# Patient Record
Sex: Male | Born: 1935 | Race: White | Hispanic: No | Marital: Married | Smoking: Former smoker
Health system: Southern US, Community
[De-identification: ages and names within clinical notes are randomized; demographics above are authoritative.]

## PROBLEM LIST (undated history)

## (undated) DIAGNOSIS — Z789 Other specified health status: Secondary | ICD-10-CM

## (undated) DIAGNOSIS — M1990 Unspecified osteoarthritis, unspecified site: Secondary | ICD-10-CM

## (undated) HISTORY — PX: TONSILLECTOMY: SUR1361

## (undated) HISTORY — PX: COLONOSCOPY: SHX174

---

## 1997-06-29 HISTORY — PX: INGUINAL HERNIA REPAIR: SUR1180

## 2005-11-18 ENCOUNTER — Ambulatory Visit (HOSPITAL_COMMUNITY): Admission: RE | Admit: 2005-11-18 | Discharge: 2005-11-18 | Payer: Self-pay | Admitting: Internal Medicine

## 2011-12-24 ENCOUNTER — Encounter (HOSPITAL_BASED_OUTPATIENT_CLINIC_OR_DEPARTMENT_OTHER): Payer: Self-pay | Admitting: *Deleted

## 2011-12-24 NOTE — Progress Notes (Signed)
Very nice man-healthy-no ht or resp  No labs needed

## 2011-12-25 ENCOUNTER — Other Ambulatory Visit: Payer: Self-pay | Admitting: Orthopaedic Surgery

## 2011-12-27 NOTE — H&P (Signed)
Damon Morgan is an 76 y.o. male.   Chief Complaint: Left achilles pain HPI: He pushed a car about 10-12 weeks ago and has pain at left achilles. Not getting any better and walking with a limp. MRI scan show a left achilles complete tear. We have discussed repairing the achilles tear to improve function and stop pain.  Past Medical History  Diagnosis Date  . No pertinent past medical history   . Arthritis     Past Surgical History  Procedure Date  . Inguinal hernia repair 1999    lt  . Tonsillectomy   . Colonoscopy     No family history on file. Social History:  reports that he quit smoking about 37 years ago. He does not have any smokeless tobacco history on file. He reports that he drinks alcohol. He reports that he does not use illicit drugs.  Allergies: No Known Allergies  No prescriptions prior to admission    No results found for this or any previous visit (from the past 48 hour(s)). No results found.  Review of Systems  Constitutional: Negative.   HENT: Negative.   Eyes: Negative.   Respiratory: Negative.   Cardiovascular: Negative.   Gastrointestinal: Negative.   Genitourinary: Negative.   Musculoskeletal: Negative.   Skin: Negative.   Neurological: Negative.   Endo/Heme/Allergies: Negative.     Height 6\' 1"  (1.854 m), weight 101.152 kg (223 lb). Physical Exam  Constitutional: He is oriented to person, place, and time. He appears well-nourished.  HENT:  Head: Atraumatic.  Eyes: Pupils are equal, round, and reactive to light.  Neck: Normal range of motion.  Cardiovascular: Regular rhythm.   Respiratory: Breath sounds normal.  GI: Bowel sounds are normal.  Musculoskeletal:       Left ankle- defect at mid achilles Weak plantar flexion. Good n/v Walks with a limp  Neurological: He is oriented to person, place, and time.  Skin: Skin is dry.  Psychiatric: He has a normal mood and affect.     Assessment/Plan A: Left achilles rupture P: We have  discussed the risks / benefits of achilles repair and the post- op course. Also the fact he will be on crutches and eventually need therapy. This will be done as a out patient.  Alika Saladin R 12/27/2011, 8:26 PM

## 2011-12-29 ENCOUNTER — Encounter (HOSPITAL_COMMUNITY): Payer: Self-pay | Admitting: Pharmacy Technician

## 2011-12-29 ENCOUNTER — Other Ambulatory Visit: Payer: Self-pay | Admitting: Orthopaedic Surgery

## 2011-12-29 ENCOUNTER — Encounter (HOSPITAL_BASED_OUTPATIENT_CLINIC_OR_DEPARTMENT_OTHER): Admission: RE | Payer: Self-pay | Source: Ambulatory Visit

## 2011-12-29 ENCOUNTER — Ambulatory Visit (HOSPITAL_BASED_OUTPATIENT_CLINIC_OR_DEPARTMENT_OTHER): Admission: RE | Admit: 2011-12-29 | Payer: Medicare Other | Source: Ambulatory Visit | Admitting: Orthopaedic Surgery

## 2011-12-29 HISTORY — DX: Other specified health status: Z78.9

## 2011-12-29 HISTORY — DX: Unspecified osteoarthritis, unspecified site: M19.90

## 2011-12-29 SURGERY — REPAIR, TENDON, ACHILLES
Anesthesia: General | Laterality: Left

## 2012-01-06 ENCOUNTER — Encounter (HOSPITAL_COMMUNITY): Payer: Self-pay

## 2012-01-06 ENCOUNTER — Encounter (HOSPITAL_COMMUNITY)
Admission: RE | Admit: 2012-01-06 | Discharge: 2012-01-06 | Disposition: A | Discharge status: Home / Self Care | Payer: Medicare Other | Source: Ambulatory Visit | Attending: Orthopaedic Surgery | Admitting: Orthopaedic Surgery

## 2012-01-06 ENCOUNTER — Ambulatory Visit (HOSPITAL_COMMUNITY)
Admission: RE | Admit: 2012-01-06 | Discharge: 2012-01-06 | Disposition: A | Discharge status: Home / Self Care | Payer: Medicare Other | Source: Ambulatory Visit | Attending: Orthopaedic Surgery | Admitting: Orthopaedic Surgery

## 2012-01-06 DIAGNOSIS — M169 Osteoarthritis of hip, unspecified: Secondary | ICD-10-CM | POA: Insufficient documentation

## 2012-01-06 DIAGNOSIS — M1610 Unilateral primary osteoarthritis, unspecified hip: Secondary | ICD-10-CM | POA: Insufficient documentation

## 2012-01-06 DIAGNOSIS — Z01811 Encounter for preprocedural respiratory examination: Secondary | ICD-10-CM | POA: Insufficient documentation

## 2012-01-06 LAB — DIFFERENTIAL
Basophils Relative: 1 % (ref 0–1)
Eosinophils Relative: 2 % (ref 0–5)
Monocytes Absolute: 0.4 10*3/uL (ref 0.1–1.0)
Monocytes Relative: 7 % (ref 3–12)
Neutro Abs: 3.3 10*3/uL (ref 1.7–7.7)

## 2012-01-06 LAB — URINALYSIS, ROUTINE W REFLEX MICROSCOPIC
Bilirubin Urine: NEGATIVE
Glucose, UA: NEGATIVE mg/dL
Ketones, ur: 15 mg/dL — AB
Nitrite: NEGATIVE
Specific Gravity, Urine: 1.027 (ref 1.005–1.030)

## 2012-01-06 LAB — BASIC METABOLIC PANEL
BUN: 21 mg/dL (ref 6–23)
CO2: 27 mEq/L (ref 19–32)
Chloride: 103 mEq/L (ref 96–112)
Creatinine, Ser: 0.95 mg/dL (ref 0.50–1.35)
Glucose, Bld: 97 mg/dL (ref 70–99)
Potassium: 4.8 mEq/L (ref 3.5–5.1)

## 2012-01-06 LAB — CBC
HCT: 43.9 % (ref 39.0–52.0)
Hemoglobin: 15.3 g/dL (ref 13.0–17.0)
MCH: 31.9 pg (ref 26.0–34.0)
MCHC: 34.9 g/dL (ref 30.0–36.0)
MCV: 91.6 fL (ref 78.0–100.0)
RDW: 13.1 % (ref 11.5–15.5)

## 2012-01-06 LAB — SURGICAL PCR SCREEN: MRSA, PCR: NEGATIVE

## 2012-01-06 LAB — TYPE AND SCREEN: ABO/RH(D): B POS

## 2012-01-06 MED ORDER — LACTATED RINGERS IV SOLN: INTRAVENOUS | Status: DC

## 2012-01-06 NOTE — Pre-Procedure Instructions (Signed)
Damon Morgan  01/06/2012   Your procedure is scheduled on:  01-12-2012  Report to [facility] at Call [phone] after 8:00 AM for arrival time  Call this number if you have problems the morning of surgery: [phone]   Remember:   Do not eat food:or drinkAfter Midnight.  .  .  Take these medicines the morning of surgery with A SIP OF WATER:    Do not wear jewelry, make-up or nail polish.  Do not wear lotions, powders, or perfumes. You may wear deodorant.  Do not shave 48 hours prior to surgery. Men may shave face and neck.  Do not bring valuables to the hospital.  Contacts, dentures or bridgework may not be worn into surgery.  Leave suitcase in the car. After surgery it may be brought to your room.  For patients admitted to the hospital, checkout time is 11:00 AM the day of discharge.       Special Instructions: Incentive Spirometry - Practice and bring it with you on the day of surgery. and CHG Shower Use Special Wash: 1/2 bottle night before surgery and 1/2 bottle morning of surgery.   Please read over the following fact sheets that you were given: Pain Booklet, Blood Transfusion Information, MRSA Information and Surgical Site Infection Prevention

## 2012-01-06 NOTE — H&P (Signed)
Damon Morgan is an 76 y.o. male.   Chief Complaint: right hip pain HPI: Damon Morgan is a 76 year old male patient well known to our practice is complaining of increasing right hip pain and groin pain.  It has been bothering him for a number of months.  He has tried oral anti-inflammatory medications including Mobic and ibuprofen.  These medicines of only afforded him temporary relief.  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  Past Medical History  Diagnosis Date  . No pertinent past medical history   . Arthritis     Past Surgical History  Procedure Date  . Inguinal hernia repair 1999    lt  . Tonsillectomy   . Colonoscopy     No family history on file. Social History:  reports that he quit smoking about 37 years ago. His smoking use included Cigarettes. He does not have any smokeless tobacco history on file. He reports that he drinks alcohol. He reports that he does not use illicit drugs.  Allergies: No Known Allergies  No prescriptions prior to admission    Results for orders placed during the hospital encounter of 01/06/12 (from the past 48 hour(s))  APTT     Status: Normal   Collection Time   01/06/12  3:55 PM      Component Value Range Comment   aPTT 27  24 - 37 seconds   BASIC METABOLIC PANEL     Status: Abnormal   Collection Time   01/06/12  3:55 PM      Component Value Range Comment   Sodium 139  135 - 145 mEq/L    Potassium 4.8  3.5 - 5.1 mEq/L    Chloride 103  96 - 112 mEq/L    CO2 27  19 - 32 mEq/L    Glucose, Bld 97  70 - 99 mg/dL    BUN 21  6 - 23 mg/dL    Creatinine, Ser 7.82  0.50 - 1.35 mg/dL    Calcium 9.5  8.4 - 95.6 mg/dL    GFR calc non Af Amer 81 (*) >90 mL/min    GFR calc Af Amer >90  >90 mL/min   CBC     Status: Normal   Collection Time   01/06/12  3:55 PM      Component Value Range Comment   WBC 5.9  4.0 - 10.5 K/uL    RBC 4.79  4.22 - 5.81 MIL/uL    Hemoglobin 15.3  13.0 - 17.0 g/dL    HCT 21.3  08.6 - 57.8 %    MCV  91.6  78.0 - 100.0 fL    MCH 31.9  26.0 - 34.0 pg    MCHC 34.9  30.0 - 36.0 g/dL    RDW 46.9  62.9 - 52.8 %    Platelets 174  150 - 400 K/uL   DIFFERENTIAL     Status: Normal   Collection Time   01/06/12  3:55 PM      Component Value Range Comment   Neutrophils Relative 56  43 - 77 %    Neutro Abs 3.3  1.7 - 7.7 K/uL    Lymphocytes Relative 34  12 - 46 %    Lymphs Abs 2.0  0.7 - 4.0 K/uL    Monocytes Relative 7  3 - 12 %    Monocytes Absolute 0.4  0.1 - 1.0 K/uL    Eosinophils Relative 2  0 - 5 %  Eosinophils Absolute 0.1  0.0 - 0.7 K/uL    Basophils Relative 1  0 - 1 %    Basophils Absolute 0.1  0.0 - 0.1 K/uL   PROTIME-INR     Status: Normal   Collection Time   01/06/12  3:55 PM      Component Value Range Comment   Prothrombin Time 14.5  11.6 - 15.2 seconds    INR 1.11  0.00 - 1.49   URINALYSIS, ROUTINE W REFLEX MICROSCOPIC     Status: Abnormal   Collection Time   01/06/12  3:58 PM      Component Value Range Comment   Color, Urine YELLOW  YELLOW    APPearance CLEAR  CLEAR    Specific Gravity, Urine 1.027  1.005 - 1.030    pH 5.5  5.0 - 8.0    Glucose, UA NEGATIVE  NEGATIVE mg/dL    Hgb urine dipstick NEGATIVE  NEGATIVE    Bilirubin Urine NEGATIVE  NEGATIVE    Ketones, ur 15 (*) NEGATIVE mg/dL    Protein, ur NEGATIVE  NEGATIVE mg/dL    Urobilinogen, UA 0.2  0.0 - 1.0 mg/dL    Nitrite NEGATIVE  NEGATIVE    Leukocytes, UA NEGATIVE  NEGATIVE MICROSCOPIC NOT DONE ON URINES WITH NEGATIVE PROTEIN, BLOOD, LEUKOCYTES, NITRITE, OR GLUCOSE <1000 mg/dL.   Dg Chest 2 View  01/06/2012  *RADIOLOGY REPORT*  Clinical Data: Preoperative respiratory exam.  Osteoarthritis of the right hip.  CHEST - 2 VIEW  Comparison: None.  Findings: The heart size and pulmonary vascularity are normal and the lungs are clear.  No acute osseous abnormality.  Fairly severe arthritic changes are visible in the shoulder joints.  IMPRESSION: No acute disease in the chest.  Original Report Authenticated By: Gwynn Burly, M.D.    Review of Systems  Constitutional: Negative.   HENT: Negative.   Eyes: Negative.   Respiratory: Negative.   Cardiovascular: Negative.   Gastrointestinal: Negative.   Genitourinary: Negative.   Musculoskeletal: Negative.   Skin: Negative.   Neurological: Negative.   Endo/Heme/Allergies: Negative.   Psychiatric/Behavioral: Negative.     There were no vitals taken for this visit. Physical Exam  Constitutional: He appears well-nourished.  HENT:  Head: Atraumatic.  Eyes: EOM are normal.  Neck: Normal range of motion.  Cardiovascular: Normal rate.   Respiratory: Effort normal.  GI: Bowel sounds are normal.  Musculoskeletal:       Damon Morgan walks with a altered gait.  He has.  Limited rotation of his right hip and it is very painful.  Forward flexion is to 95 he has a slight hip flexion contracture.  Good neurovascular status distally to his toes.  Neurological: He is alert.  Skin: Skin is warm.  Psychiatric: He has a normal mood and affect.     Assessment/Plan Assessment: Right hip end-stage bone-on-bone DJD. Plan: We have discussed proceeding with a right hip replacement and have reviewed the risks of anesthesia, infection and DVT associated with this procedure.  Also discussed the hospital stay of approximately 3 days and need for physical therapy postoperatively.  The reason to do the surgery is to decrease his pain and improve his function.  Capitola Ladson R 01/06/2012, 5:37 PM

## 2012-01-07 NOTE — Consult Note (Signed)
Anesthesia Chart Review:  Patient is a 76 year old male scheduled for right THA on 01/12/12 by Dr. Jerl Santos.  History include arthritis, former smoker, prior IHR and tonsillectomy. He had a brief syncopal episode in September 2012 after sudden, rapid sneezing X 5 times.  He felt a little tired afterwards but otherwise no associated symptoms.  He saw his PCP Dr. Kirby Funk who felt it was likely related to sneezing and temporary decrease in intrathoracic blood volume.  He did not recommend any additional work-up at that time.  EKG on 01/06/12 showed SB @ 57 bpm with marked sinus arrythmia and first degree AVB, septal infarct (age undetermined).  There is a low voltage QRS in V3, otherwise his EKG is not significantly changed since 01/14/10.    CXR on 01/06/12 showed no acute disease of the chest.  Labs noted.  Reviewed above with Anesthesiologist Dr. Chaney Malling.  If no significant change in his status then plan to proceed.  Shonna Chock, PA-C

## 2012-01-11 MED ORDER — CEFAZOLIN SODIUM-DEXTROSE 2-3 GM-% IV SOLR
2.0000 g | INTRAVENOUS | Status: AC
  Administered 2012-01-12: 2 g via INTRAVENOUS
  Filled 2012-01-11: qty 50

## 2012-01-12 ENCOUNTER — Ambulatory Visit (HOSPITAL_COMMUNITY): Payer: Medicare Other

## 2012-01-12 ENCOUNTER — Encounter (HOSPITAL_COMMUNITY): Payer: Self-pay | Admitting: Vascular Surgery

## 2012-01-12 ENCOUNTER — Inpatient Hospital Stay (HOSPITAL_COMMUNITY)
Admission: RE | Admit: 2012-01-12 | Discharge: 2012-01-14 | DRG: 470 | Disposition: A | Discharge status: Home Health | Payer: Medicare Other | Source: Ambulatory Visit | Attending: Orthopaedic Surgery | Admitting: Orthopaedic Surgery

## 2012-01-12 ENCOUNTER — Inpatient Hospital Stay (HOSPITAL_COMMUNITY): Payer: Medicare Other

## 2012-01-12 ENCOUNTER — Encounter (HOSPITAL_COMMUNITY)
Admission: RE | Disposition: A | Discharge status: Home Health | Payer: Self-pay | Source: Ambulatory Visit | Attending: Orthopaedic Surgery

## 2012-01-12 ENCOUNTER — Encounter (HOSPITAL_COMMUNITY): Payer: Self-pay | Admitting: *Deleted

## 2012-01-12 ENCOUNTER — Ambulatory Visit (HOSPITAL_COMMUNITY): Payer: Medicare Other | Admitting: Vascular Surgery

## 2012-01-12 DIAGNOSIS — M1610 Unilateral primary osteoarthritis, unspecified hip: Principal | ICD-10-CM | POA: Diagnosis present

## 2012-01-12 DIAGNOSIS — M169 Osteoarthritis of hip, unspecified: Secondary | ICD-10-CM

## 2012-01-12 DIAGNOSIS — Z7982 Long term (current) use of aspirin: Secondary | ICD-10-CM

## 2012-01-12 DIAGNOSIS — Z79899 Other long term (current) drug therapy: Secondary | ICD-10-CM

## 2012-01-12 DIAGNOSIS — Z87891 Personal history of nicotine dependence: Secondary | ICD-10-CM

## 2012-01-12 HISTORY — PX: TOTAL HIP ARTHROPLASTY: SHX124

## 2012-01-12 HISTORY — PX: ARTHROPLASTY: SHX135

## 2012-01-12 SURGERY — ARTHROPLASTY, HIP, TOTAL, ANTERIOR APPROACH
Anesthesia: General | Laterality: Right | Wound class: Clean

## 2012-01-12 MED ORDER — ALUM & MAG HYDROXIDE-SIMETH 200-200-20 MG/5ML PO SUSP: 30.0000 mL | ORAL | Status: DC | PRN

## 2012-01-12 MED ORDER — PROPOFOL 10 MG/ML IV BOLUS
INTRAVENOUS | Status: DC | PRN
  Administered 2012-01-12: 60 mg via INTRAVENOUS

## 2012-01-12 MED ORDER — HYDROMORPHONE HCL PF 1 MG/ML IJ SOLN
0.2500 mg | INTRAMUSCULAR | Status: DC | PRN
  Administered 2012-01-12 (×2): 0.5 mg via INTRAVENOUS

## 2012-01-12 MED ORDER — HYDROMORPHONE HCL PF 1 MG/ML IJ SOLN: 0.5000 mg | INTRAMUSCULAR | Status: DC | PRN

## 2012-01-12 MED ORDER — PHENOL 1.4 % MT LIQD: 1.0000 | OROMUCOSAL | Status: DC | PRN

## 2012-01-12 MED ORDER — 0.9 % SODIUM CHLORIDE (POUR BTL) OPTIME
TOPICAL | Status: DC | PRN
  Administered 2012-01-12: 1000 mL

## 2012-01-12 MED ORDER — METHOCARBAMOL 500 MG PO TABS
500.0000 mg | ORAL_TABLET | Freq: Four times a day (QID) | ORAL | Status: DC | PRN
  Administered 2012-01-13 (×2): 500 mg via ORAL
  Filled 2012-01-12 (×3): qty 1

## 2012-01-12 MED ORDER — CELECOXIB 200 MG PO CAPS
200.0000 mg | ORAL_CAPSULE | Freq: Two times a day (BID) | ORAL | Status: DC
  Administered 2012-01-12 – 2012-01-14 (×4): 200 mg via ORAL
  Filled 2012-01-12 (×5): qty 1

## 2012-01-12 MED ORDER — LACTATED RINGERS IV SOLN
INTRAVENOUS | Status: DC
  Administered 2012-01-12: 18:00:00 via INTRAVENOUS
  Administered 2012-01-12: 20 mL/h via INTRAVENOUS
  Administered 2012-01-12 (×2): via INTRAVENOUS

## 2012-01-12 MED ORDER — MEPERIDINE HCL 25 MG/ML IJ SOLN: 6.2500 mg | INTRAMUSCULAR | Status: DC | PRN

## 2012-01-12 MED ORDER — MIDAZOLAM HCL 2 MG/2ML IJ SOLN: 0.5000 mg | Freq: Once | INTRAMUSCULAR | Status: DC | PRN

## 2012-01-12 MED ORDER — LACTATED RINGERS IV SOLN
INTRAVENOUS | Status: DC
  Administered 2012-01-12 – 2012-01-13 (×2): via INTRAVENOUS

## 2012-01-12 MED ORDER — DOCUSATE SODIUM 100 MG PO CAPS
100.0000 mg | ORAL_CAPSULE | Freq: Two times a day (BID) | ORAL | Status: DC
  Administered 2012-01-13 – 2012-01-14 (×3): 100 mg via ORAL
  Filled 2012-01-12 (×4): qty 1

## 2012-01-12 MED ORDER — GLYCOPYRROLATE 0.2 MG/ML IJ SOLN
INTRAMUSCULAR | Status: DC | PRN
  Administered 2012-01-12: 0.6 mg via INTRAVENOUS

## 2012-01-12 MED ORDER — FERROUS SULFATE 325 (65 FE) MG PO TABS
325.0000 mg | ORAL_TABLET | Freq: Every day | ORAL | Status: DC
  Administered 2012-01-13 – 2012-01-14 (×2): 325 mg via ORAL
  Filled 2012-01-12 (×3): qty 1

## 2012-01-12 MED ORDER — ROCURONIUM BROMIDE 100 MG/10ML IV SOLN
INTRAVENOUS | Status: DC | PRN
  Administered 2012-01-12: 50 mg via INTRAVENOUS

## 2012-01-12 MED ORDER — MORPHINE SULFATE 10 MG/ML IJ SOLN
INTRAMUSCULAR | Status: DC | PRN
  Administered 2012-01-12: 4 mg via INTRAVENOUS
  Administered 2012-01-12: 2 mg via INTRAVENOUS
  Administered 2012-01-12: 4 mg via INTRAVENOUS

## 2012-01-12 MED ORDER — METHOCARBAMOL 100 MG/ML IJ SOLN
500.0000 mg | Freq: Four times a day (QID) | INTRAVENOUS | Status: DC | PRN
  Administered 2012-01-12: 500 mg via INTRAVENOUS
  Filled 2012-01-12: qty 5

## 2012-01-12 MED ORDER — PROMETHAZINE HCL 25 MG/ML IJ SOLN: 6.2500 mg | INTRAMUSCULAR | Status: DC | PRN

## 2012-01-12 MED ORDER — CEFAZOLIN SODIUM-DEXTROSE 2-3 GM-% IV SOLR
2.0000 g | Freq: Four times a day (QID) | INTRAVENOUS | Status: AC
  Administered 2012-01-12 – 2012-01-13 (×2): 2 g via INTRAVENOUS
  Filled 2012-01-12 (×2): qty 50

## 2012-01-12 MED ORDER — NEOSTIGMINE METHYLSULFATE 1 MG/ML IJ SOLN
INTRAMUSCULAR | Status: DC | PRN
  Administered 2012-01-12: 5 mg via INTRAVENOUS

## 2012-01-12 MED ORDER — FLEET ENEMA 7-19 GM/118ML RE ENEM: 1.0000 | ENEMA | Freq: Once | RECTAL | Status: AC | PRN

## 2012-01-12 MED ORDER — FENTANYL CITRATE 0.05 MG/ML IJ SOLN
INTRAMUSCULAR | Status: DC | PRN
  Administered 2012-01-12: 50 ug via INTRAVENOUS
  Administered 2012-01-12: 100 ug via INTRAVENOUS
  Administered 2012-01-12: 50 ug via INTRAVENOUS
  Administered 2012-01-12 (×2): 100 ug via INTRAVENOUS
  Administered 2012-01-12 (×2): 50 ug via INTRAVENOUS

## 2012-01-12 MED ORDER — ACETAMINOPHEN 650 MG RE SUPP: 650.0000 mg | Freq: Four times a day (QID) | RECTAL | Status: DC | PRN

## 2012-01-12 MED ORDER — ACETAMINOPHEN 325 MG PO TABS: 650.0000 mg | ORAL_TABLET | Freq: Four times a day (QID) | ORAL | Status: DC | PRN

## 2012-01-12 MED ORDER — MENTHOL 3 MG MT LOZG: 1.0000 | LOZENGE | OROMUCOSAL | Status: DC | PRN

## 2012-01-12 MED ORDER — METOCLOPRAMIDE HCL 5 MG/ML IJ SOLN: 5.0000 mg | Freq: Three times a day (TID) | INTRAMUSCULAR | Status: DC | PRN

## 2012-01-12 MED ORDER — BISACODYL 5 MG PO TBEC: 5.0000 mg | DELAYED_RELEASE_TABLET | Freq: Every day | ORAL | Status: DC | PRN

## 2012-01-12 MED ORDER — ONDANSETRON HCL 4 MG/2ML IJ SOLN: 4.0000 mg | Freq: Four times a day (QID) | INTRAMUSCULAR | Status: DC | PRN

## 2012-01-12 MED ORDER — HYDROMORPHONE HCL PF 1 MG/ML IJ SOLN
INTRAMUSCULAR | Status: AC
  Filled 2012-01-12: qty 1

## 2012-01-12 MED ORDER — METOCLOPRAMIDE HCL 10 MG PO TABS: 5.0000 mg | ORAL_TABLET | Freq: Three times a day (TID) | ORAL | Status: DC | PRN

## 2012-01-12 MED ORDER — ASPIRIN EC 325 MG PO TBEC
325.0000 mg | DELAYED_RELEASE_TABLET | Freq: Two times a day (BID) | ORAL | Status: DC
  Administered 2012-01-13 – 2012-01-14 (×3): 325 mg via ORAL
  Filled 2012-01-12 (×5): qty 1

## 2012-01-12 MED ORDER — OXYCODONE HCL 5 MG PO TABS
5.0000 mg | ORAL_TABLET | ORAL | Status: DC | PRN
  Administered 2012-01-13 (×3): 10 mg via ORAL
  Filled 2012-01-12 (×3): qty 2

## 2012-01-12 MED ORDER — EPHEDRINE SULFATE 50 MG/ML IJ SOLN
INTRAMUSCULAR | Status: DC | PRN
  Administered 2012-01-12: 10 mg via INTRAVENOUS

## 2012-01-12 MED ORDER — ONDANSETRON HCL 4 MG PO TABS: 4.0000 mg | ORAL_TABLET | Freq: Four times a day (QID) | ORAL | Status: DC | PRN

## 2012-01-12 MED ORDER — LIDOCAINE HCL (CARDIAC) 20 MG/ML IV SOLN
INTRAVENOUS | Status: DC | PRN
  Administered 2012-01-12: 40 mg via INTRAVENOUS

## 2012-01-12 MED ORDER — MIDAZOLAM HCL 5 MG/5ML IJ SOLN
INTRAMUSCULAR | Status: DC | PRN
  Administered 2012-01-12: 2 mg via INTRAVENOUS

## 2012-01-12 SURGICAL SUPPLY — 57 items
BLADE SAW SGTL 18X1.27X75 (BLADE) ×2 IMPLANT
BLADE SURG ROTATE 9660 (MISCELLANEOUS) ×2 IMPLANT
BRUSH FEMORAL CANAL (MISCELLANEOUS) IMPLANT
CELLS DAT CNTRL 66122 CELL SVR (MISCELLANEOUS) ×1 IMPLANT
CLOTH BEACON ORANGE TIMEOUT ST (SAFETY) ×2 IMPLANT
COVER BACK TABLE 24X17X13 BIG (DRAPES) ×2 IMPLANT
COVER SURGICAL LIGHT HANDLE (MISCELLANEOUS) ×2 IMPLANT
Corail  KLA 15 ×2 IMPLANT
DRAPE C-ARM 42X72 X-RAY (DRAPES) ×2 IMPLANT
DRAPE INCISE IOBAN 85X60 (DRAPES) IMPLANT
DRAPE STERI IOBAN 125X83 (DRAPES) ×2 IMPLANT
DRAPE U-SHAPE 47X51 STRL (DRAPES) ×6 IMPLANT
DRSG MEPILEX BORDER 4X12 (GAUZE/BANDAGES/DRESSINGS) IMPLANT
DRSG MEPILEX BORDER 4X8 (GAUZE/BANDAGES/DRESSINGS) ×2 IMPLANT
DURAPREP 26ML APPLICATOR (WOUND CARE) ×2 IMPLANT
ELECT BLADE 4.0 EZ CLEAN MEGAD (MISCELLANEOUS)
ELECT BLADE TIP CTD 4 INCH (ELECTRODE) ×2 IMPLANT
ELECT CAUTERY BLADE 6.4 (BLADE) ×2 IMPLANT
ELECT REM PT RETURN 9FT ADLT (ELECTROSURGICAL) ×2
ELECTRODE BLDE 4.0 EZ CLN MEGD (MISCELLANEOUS) IMPLANT
ELECTRODE REM PT RTRN 9FT ADLT (ELECTROSURGICAL) ×1 IMPLANT
FACESHIELD LNG OPTICON STERILE (SAFETY) ×4 IMPLANT
GAUZE XEROFORM 1X8 LF (GAUZE/BANDAGES/DRESSINGS) ×2 IMPLANT
GLOVE BIO SURGEON STRL SZ7.5 (GLOVE) ×2 IMPLANT
GLOVE BIO SURGEON STRL SZ8 (GLOVE) ×2 IMPLANT
GLOVE BIO SURGEON STRL SZ8.5 (GLOVE) ×2 IMPLANT
GLOVE BIOGEL PI IND STRL 7.5 (GLOVE) ×1 IMPLANT
GLOVE BIOGEL PI IND STRL 8 (GLOVE) ×3 IMPLANT
GLOVE BIOGEL PI IND STRL 8.5 (GLOVE) ×1 IMPLANT
GLOVE BIOGEL PI INDICATOR 7.5 (GLOVE) ×1
GLOVE BIOGEL PI INDICATOR 8 (GLOVE) ×3
GLOVE BIOGEL PI INDICATOR 8.5 (GLOVE) ×1
GOWN PREVENTION PLUS LG XLONG (DISPOSABLE) IMPLANT
GOWN STRL NON-REIN LRG LVL3 (GOWN DISPOSABLE) ×4 IMPLANT
GOWN STRL REIN XL XLG (GOWN DISPOSABLE) ×2 IMPLANT
KIT BASIN OR (CUSTOM PROCEDURE TRAY) ×2 IMPLANT
KIT ROOM TURNOVER OR (KITS) ×2 IMPLANT
MANIFOLD NEPTUNE II (INSTRUMENTS) ×2 IMPLANT
NS IRRIG 1000ML POUR BTL (IV SOLUTION) ×2 IMPLANT
PACK TOTAL JOINT (CUSTOM PROCEDURE TRAY) ×2 IMPLANT
PAD ARMBOARD 7.5X6 YLW CONV (MISCELLANEOUS) ×6 IMPLANT
RTRCTR WOUND ALEXIS 18CM MED (MISCELLANEOUS) ×2
SPONGE LAP 18X18 X RAY DECT (DISPOSABLE) ×2 IMPLANT
SPONGE LAP 4X18 X RAY DECT (DISPOSABLE) IMPLANT
STAPLER VISISTAT 35W (STAPLE) ×2 IMPLANT
SUT ETHIBOND NAB CT1 #1 30IN (SUTURE) ×6 IMPLANT
SUT VIC AB 0 CT1 27 (SUTURE) ×4
SUT VIC AB 0 CT1 27XBRD ANBCTR (SUTURE) ×2 IMPLANT
SUT VIC AB 1 CT1 27 (SUTURE) ×4
SUT VIC AB 1 CT1 27XBRD ANBCTR (SUTURE) ×2 IMPLANT
SUT VIC AB 2-0 CT1 27 (SUTURE) ×4
SUT VIC AB 2-0 CT1 TAPERPNT 27 (SUTURE) ×2 IMPLANT
SUT VLOC 180 0 24IN GS25 (SUTURE) ×2 IMPLANT
TOWEL OR 17X24 6PK STRL BLUE (TOWEL DISPOSABLE) ×2 IMPLANT
TOWEL OR 17X26 10 PK STRL BLUE (TOWEL DISPOSABLE) ×4 IMPLANT
TRAY FOLEY CATH 14FR (SET/KITS/TRAYS/PACK) ×2 IMPLANT
WATER STERILE IRR 1000ML POUR (IV SOLUTION) ×4 IMPLANT

## 2012-01-12 NOTE — Progress Notes (Signed)
Upper right thigh area appears sl. Puffy---Dr Dalldorf here and aware.  Ice pack to site.  MD looked at post op Xray.  Will cont to monitor.

## 2012-01-12 NOTE — Op Note (Signed)
PRE-OP DIAGNOSIS:  RIGHT HIP DEGENERATIVE JOINT DISEASE POST-OP DIAGNOSIS:  RIGHT HIP DEGENERATIVE JOINT DISEASE PROCEDURE:  Procedure(s): TOTAL HIP ARTHROPLASTY RIGHT HIP ANESTHESIA:  General SURGEON:  Marcene Corning MD ASSISTANT:  Allie Bossier MD   INDICATIONS FOR PROCEDURE:  The patient is a 76 y.o. male with a long history of a painful hip.  This has persisted despite multiple conservative measures.  The patient has persisted with pain and dysfunction making rest and activity difficult.  A total hip replacement is offered as surgical treatment.  Informed operative consent was obtained after discussion of possible complications including reaction to anesthesia, infection, neurovascular injury, dislocation, DVT, PE, and death.  The importance of the postoperative rehab program to optimize result was stressed with the patient.  SUMMARY OF FINDINGS AND PROCEDURE:  Under general anesthesia through a anterior approach an the Hana table a right THR was performed.  The patient had severe degenerative change and excellent bone quality.  We used DePuy components to replace the hip and these were size 15 KLA Corail femur capped with a 36mm +8.5 stainless steel hip ball.  On the acetabular side we used a size 56 griptioin shell with a size 56 plus 4 neutral polyethylene liner.  We did use a hole eliminator.  Allie Bossier MD assisted throughout and was invaluable to the completion of the case in that he helped position and retract while I performed the procedure.  He also closed simultaneously to help minimize OR time.  DESCRIPTION OF PROCEDURE:  The patient was taken to the OR suite where general anesthetic was applied.  The patient was then positioned on the Hana table supine.  All bony prominences were appropriately padded.  Prep and drape was then performed in normal sterile fashion.  The patient was given Kefzol preoperative antibiotic and an appropriate time out was performed.  We then took an anterior  approach to the right hip.  Dissection was taken through adipose to the tensor fascia lata fascia.  This structure was incised longitudinally and we dissected in the intermuscular interval just medial to this muscle.  Cobra retractors were placed superior and inferior to the femoral neck superficial to the capsule.  A capsular incision was then made and the retractors were placed along the femoral neck.  Xray was brought in to get a good level for the femoral neck cut which was made with an oscillating saw and osteotome.  The femoral head was removed with a corkscrew.  The acetabulum was exposed and some labral tissues were excised. Reaming was taken to the inside wall of the pelvis and sequentially up to 1 mm smaller than the actual component.  A trial of components was done and then the aforementioned acetabular shell was placed in appropriate tilt and anteversion confirmed by fluoroscopy. The liner was placed along with the hole eliminator and attention was turned to the femur.  The leg was brought down and over into adduction and the elevator bar was used to raise the femur up gently in the wound.  The piriformis was released with care taken to preserve the obturator internus attachment and all of the posterior capsule. The femur was reamed and then broached to the appropriate size.  A trial reduction was done and the aforementioned head and neck assembly gave [place] the best stability in extension with external rotation.  Leg lengths were felt to be about equal by fluoroscopic exam.  The trial components were removed and the wound irrigated.  We then placed the  femoral component in appropriate anteversion.  The head was applied to a dry stem neck and the hip again reduced.  It was again stable in the aforementioned position.  The would was irrigated again followed by re-approximation of anterior capsule with ethibond suture. Tensor fascia was repaired with V-loc suture  followed by subcutaneous closure with #O and  #2 undyed vicryl.  Skin was closed with staples followed by a sterile dressing.  EBL and IOF can be obtained from anesthesia records.  DISPOSITION:  The patient was extubated in the OR and taken to PACU in stable condition to be admitted to the Orthopedic Surgery for appropriate post-op care to include perioperative antibiotics and DVT prophylaxis.

## 2012-01-12 NOTE — Preoperative (Signed)
Beta Blockers   Reason not to administer Beta Blockers:Not Applicable 

## 2012-01-12 NOTE — H&P (View-Only) (Signed)
Damon Morgan is an 75 y.o. male.   Chief Complaint: Left achilles pain HPI: He pushed a car about 10-12 weeks ago and has pain at left achilles. Not getting any better and walking with a limp. MRI scan show a left achilles complete tear. We have discussed repairing the achilles tear to improve function and stop pain.  Past Medical History  Diagnosis Date  . No pertinent past medical history   . Arthritis     Past Surgical History  Procedure Date  . Inguinal hernia repair 1999    lt  . Tonsillectomy   . Colonoscopy     No family history on file. Social History:  reports that he quit smoking about 37 years ago. He does not have any smokeless tobacco history on file. He reports that he drinks alcohol. He reports that he does not use illicit drugs.  Allergies: No Known Allergies  No prescriptions prior to admission    No results found for this or any previous visit (from the past 48 hour(s)). No results found.  Review of Systems  Constitutional: Negative.   HENT: Negative.   Eyes: Negative.   Respiratory: Negative.   Cardiovascular: Negative.   Gastrointestinal: Negative.   Genitourinary: Negative.   Musculoskeletal: Negative.   Skin: Negative.   Neurological: Negative.   Endo/Heme/Allergies: Negative.     Height 6' 1" (1.854 m), weight 101.152 kg (223 lb). Physical Exam  Constitutional: He is oriented to person, place, and time. He appears well-nourished.  HENT:  Head: Atraumatic.  Eyes: Pupils are equal, round, and reactive to light.  Neck: Normal range of motion.  Cardiovascular: Regular rhythm.   Respiratory: Breath sounds normal.  GI: Bowel sounds are normal.  Musculoskeletal:       Left ankle- defect at mid achilles Weak plantar flexion. Good n/v Walks with a limp  Neurological: He is oriented to person, place, and time.  Skin: Skin is dry.  Psychiatric: He has a normal mood and affect.     Assessment/Plan A: Left achilles rupture P: We have  discussed the risks / benefits of achilles repair and the post- op course. Also the fact he will be on crutches and eventually need therapy. This will be done as a out patient.  Jaylie Neaves R 12/27/2011, 8:26 PM    

## 2012-01-12 NOTE — Anesthesia Preprocedure Evaluation (Addendum)
Anesthesia Evaluation  Patient identified by MRN, date of birth, ID band Patient awake    Reviewed: Allergy & Precautions, H&P , NPO status , Patient's Chart, lab work & pertinent test results, reviewed documented beta blocker date and time   History of Anesthesia Complications Negative for: history of anesthetic complications  Airway Mallampati: I TM Distance: >3 FB Neck ROM: Full    Dental  (+) Teeth Intact and Dental Advisory Given   Pulmonary former smoker (quit 1976),  breath sounds clear to auscultation  Pulmonary exam normal       Cardiovascular negative cardio ROS  Rhythm:Regular Rate:Normal     Neuro/Psych negative neurological ROS  negative psych ROS   GI/Hepatic negative GI ROS, Neg liver ROS,   Endo/Other  negative endocrine ROS  Renal/GU negative Renal ROS     Musculoskeletal  (+) Arthritis -,   Abdominal (+) + obese,   Peds  Hematology   Anesthesia Other Findings   Reproductive/Obstetrics                         Anesthesia Physical Anesthesia Plan  ASA: II  Anesthesia Plan: General   Post-op Pain Management:    Induction: Intravenous  Airway Management Planned: Oral ETT  Additional Equipment:   Intra-op Plan:   Post-operative Plan: Extubation in OR  Informed Consent: I have reviewed the patients History and Physical, chart, labs and discussed the procedure including the risks, benefits and alternatives for the proposed anesthesia with the patient or authorized representative who has indicated his/her understanding and acceptance.   Dental advisory given  Plan Discussed with: Anesthesiologist, Surgeon and CRNA  Anesthesia Plan Comments: (Plan routine monitors, GETA)       Anesthesia Quick Evaluation

## 2012-01-12 NOTE — Anesthesia Postprocedure Evaluation (Signed)
Anesthesia Post Note  Patient: Damon Morgan  Procedure(s) Performed: Procedure(s) (LRB): TOTAL HIP ARTHROPLASTY ANTERIOR APPROACH (Right)  Anesthesia type: general  Patient location: PACU  Post pain: Pain level controlled  Post assessment: Patient's Cardiovascular Status Stable  Last Vitals:  Filed Vitals:   01/12/12 2051  BP:   Pulse: 58  Temp:   Resp: 15    Post vital signs: Reviewed and stable  Level of consciousness: sedated  Complications: No apparent anesthesia complications

## 2012-01-12 NOTE — Progress Notes (Signed)
Orthopedic Tech Progress Note Patient Details:  Damon Morgan [birth year] [id number]  Patient ID: Damon Morgan, male   DOB: 1936-02-13, 76 y.o.   MRN: [id number] Trapeze bar patient helper Viewed order from doctor's order list Nikki Dom 01/12/2012, 10:25 PM

## 2012-01-12 NOTE — Transfer of Care (Signed)
Immediate Anesthesia Transfer of Care Note  Patient: Damon Morgan  Procedure(s) Performed: Procedure(s) (LRB): TOTAL HIP ARTHROPLASTY ANTERIOR APPROACH (Right)  Patient Location: PACU  Anesthesia Type: General  Level of Consciousness: awake, alert  and oriented  Airway & Oxygen Therapy: Patient Spontanous Breathing and Patient connected to face mask oxygen  Post-op Assessment: Report given to PACU RN and Post -op Vital signs reviewed and stable  Post vital signs: Reviewed and stable  Complications: No apparent anesthesia complications

## 2012-01-12 NOTE — Interval H&P Note (Signed)
History and Physical Interval Note:  01/12/2012 4:06 PM  Damon Morgan  has presented today for surgery, with the diagnosis of RIGHT HIP DEGENERATIVE JOINT DISEASE  The various methods of treatment have been discussed with the patient and family. After consideration of risks, benefits and other options for treatment, the patient has consented to  Procedure(s) (LRB): TOTAL HIP ARTHROPLASTY ANTERIOR APPROACH (Right) as a surgical intervention .  The patient's history has been reviewed, patient examined, no change in status, stable for surgery.  I have reviewed the patients' chart and labs.  Questions were answered to the patient's satisfaction.     Ayerim Berquist G

## 2012-01-13 ENCOUNTER — Encounter (HOSPITAL_COMMUNITY): Payer: Self-pay | Admitting: General Practice

## 2012-01-13 LAB — CBC
Hemoglobin: 11.1 g/dL — ABNORMAL LOW (ref 13.0–17.0)
MCH: 31.9 pg (ref 26.0–34.0)
MCHC: 34.3 g/dL (ref 30.0–36.0)
MCV: 93.1 fL (ref 78.0–100.0)
RBC: 3.48 MIL/uL — ABNORMAL LOW (ref 4.22–5.81)

## 2012-01-13 LAB — BASIC METABOLIC PANEL
BUN: 15 mg/dL (ref 6–23)
CO2: 27 mEq/L (ref 19–32)
Calcium: 8.2 mg/dL — ABNORMAL LOW (ref 8.4–10.5)
Creatinine, Ser: 0.8 mg/dL (ref 0.50–1.35)
GFR calc non Af Amer: 87 mL/min — ABNORMAL LOW (ref >90)
Glucose, Bld: 136 mg/dL — ABNORMAL HIGH (ref 70–99)

## 2012-01-13 NOTE — Evaluation (Signed)
Physical Therapy Evaluation Patient Details Name: Damon Morgan MRN: [id number] DOB: 02-02-1936 Today's Date: 01/13/2012 Time: 4098-1191 PT Time Calculation (min): 29 min  PT Assessment / Plan / Recommendation Clinical Impression  Pt is a 76 y/o male s/p right anterior hip replacement.  Pt will be followed by Acute PT to maximize functional mobility.       PT Assessment  Patient needs continued PT services    Follow Up Recommendations  Home health PT    Barriers to Discharge None none    Equipment Recommendations  None recommended by PT    Recommendations for Other Services     Frequency 7X/week    Precautions / Restrictions Precautions Precautions: Anterior Hip Precaution Booklet Issued: Yes (comment) Restrictions Weight Bearing Restrictions: Yes RLE Weight Bearing: Weight bearing as tolerated   Pertinent Vitals/Pain 1-2/10 Rt anterior thigh.  No intervention required.      Mobility  Bed Mobility Bed Mobility: Supine to Sit Supine to Sit: 4: Min assist;HOB flat Transfers Transfers: Sit to Stand;Stand to Sit Sit to Stand: 4: Min guard;With upper extremity assist;From bed Stand to Sit: 4: Min guard;To chair/3-in-1;With upper extremity assist Details for Transfer Assistance: Cues for hand placement.  Ambulation/Gait Ambulation/Gait Assistance: 4: Min guard Ambulation Distance (Feet): 30 Feet Assistive device: Rolling walker Ambulation/Gait Assistance Details: Pt demonstrates proper sequencing.  Gait Pattern: Step-to pattern;Decreased stride length Stairs: No Wheelchair Mobility Wheelchair Mobility: No    Exercises Total Joint Exercises Ankle Circles/Pumps: Both;10 reps;Supine Quad Sets: Both;10 reps;Seated Gluteal Sets: Both;10 reps;Seated Heel Slides: Right;10 reps;Supine   PT Diagnosis: Difficulty walking;Abnormality of gait;Acute pain  PT Problem List: Decreased mobility;Decreased knowledge of use of DME;Decreased knowledge of precautions;Pain;Decreased  strength PT Treatment Interventions: Gait training;Functional mobility training;Therapeutic activities;Therapeutic exercise;Patient/family education;Neuromuscular re-education;DME instruction   PT Goals Acute Rehab PT Goals PT Goal Formulation: With patient/family Time For Goal Achievement: 01/20/12 Potential to Achieve Goals: Good Pt will go Supine/Side to Sit: with modified independence;with HOB 0 degrees PT Goal: Supine/Side to Sit - Progress: Goal set today Pt will go Sit to Supine/Side: with modified independence PT Goal: Sit to Supine/Side - Progress: Goal set today Pt will Ambulate: 51 - 150 feet;with modified independence;with least restrictive assistive device PT Goal: Ambulate - Progress: Goal set today Pt will Perform Home Exercise Program: with supervision, verbal cues required/provided PT Goal: Perform Home Exercise Program - Progress: Goal set today  Visit Information  Last PT Received On: 01/13/12 Assistance Needed: +1    Subjective Data  Subjective: agree to PT eval Patient Stated Goal: walk without pain   Prior Functioning  Home Living Lives With: Spouse Available Help at Discharge: Family Type of Home: House Home Access: Ramped entrance Home Layout: Two level;Able to live on main level with bedroom/bathroom Alternate Level Stairs-Number of Steps: 3 Alternate Level Stairs-Rails: Can reach both Bathroom Shower/Tub: Walk-in shower;Door [employer] Toilet: Standard Bathroom Accessibility: Yes How Accessible: Accessible via walker;Accessible via wheelchair Home Adaptive Equipment: Built-in shower seat;Grab bars in shower;Grab bars around toilet;Hand-held shower hose;Straight cane;Walker - rolling;Walker - four wheeled;Walker - standard;Wheelchair - manual Prior Function Level of Independence: Independent Able to Take Stairs?: Yes Driving: Yes 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 difficulties Dominant Hand: Right    Cognition  Overall Cognitive  Status: Appears within functional limits for tasks assessed/performed Arousal/Alertness: Awake/alert Orientation Level: Oriented X4 / Intact Behavior During Session: WFL for tasks performed    Extremity/Trunk Assessment Right Upper Extremity Assessment RUE ROM/Strength/Tone: Within functional levels RUE Coordination: WFL - gross motor  Left Upper Extremity Assessment LUE ROM/Strength/Tone: Within functional levels LUE Sensation: WFL - Proprioception;WFL - Light Touch LUE Coordination: WFL - gross motor Left Lower Extremity Assessment LLE ROM/Strength/Tone: Within functional levels LLE Sensation: WFL - Light Touch;WFL - Proprioception LLE Coordination: WFL - gross motor Trunk Assessment Trunk Assessment: Normal   Balance Balance Balance Assessed: No  End of Session PT - End of Session Equipment Utilized During Treatment: Gait belt Activity Tolerance: Patient tolerated treatment well Patient left: in chair;with call bell/phone within reach Nurse Communication: Mobility status  GP     Teagen Mcleary 01/13/2012, 11:48 AM Theron Arista L. Leilana Mcquire DPT [phone]

## 2012-01-13 NOTE — Progress Notes (Signed)
Subjective: 1 Day Post-Op Procedure(s) (LRB): TOTAL HIP ARTHROPLASTY ANTERIOR APPROACH (Right)  Activity level:  Bedrest thusfar Diet tolerance:  regular Voiding:  Catheter just removed Patient reports pain as mild.    Objective: Vital signs in last 24 hours: Temp:  [97 F (36.1 C)-98.9 F (37.2 C)] 97 F (36.1 C) (07/17 0618) Pulse Rate:  [53-62] 59  (07/17 0618) Resp:  [14-18] 18  (07/17 0618) BP: (103-141)/(47-79) 103/57 mmHg (07/17 0618) SpO2:  [97 %-100 %] 97 % (07/17 0618)  Labs:  Basename 01/13/12 0520  HGB 11.1*    Basename 01/13/12 0520  WBC 6.4  RBC 3.48*  HCT 32.4*  PLT 144*    Basename 01/13/12 0520  NA 141  K 4.7  CL 106  CO2 27  BUN 15  CREATININE 0.80  GLUCOSE 136*  CALCIUM 8.2*   No results found for this basename: LABPT:2,INR:2 in the last 72 hours  Physical Exam:  ABD soft Neurovascular intact Sensation intact distally Intact pulses distally Dorsiflexion/Plantar flexion intact Compartment soft  Assessment/Plan:  1 Day Post-Op Procedure(s) (LRB): TOTAL HIP ARTHROPLASTY ANTERIOR APPROACH (Right)  Up with therapy D/C IV fluids Plan home with wife and nearby daughters in 1-2 days depending on PT input ASA BID for 4 weeks for DVT prophylaxis     Jerald Villalona G 01/13/2012, 8:05 AM

## 2012-01-13 NOTE — Progress Notes (Signed)
UR COMPLETED  

## 2012-01-13 NOTE — Progress Notes (Signed)
Physical Therapy Treatment Patient Details Name: Damon Morgan MRN: [id number] DOB: 08-06-1935 Today's Date: 01/13/2012 Time: 8657-8469 PT Time Calculation (min): 31 min  PT Assessment / Plan / Recommendation Comments on Treatment Session  Pt doing exceptionally well.  Most acute PT goals have been met. Barring any setbacks pt should be appropriate for discharge to home when cleared by MD.  Pt and spouse agree pt likely to be discharged tomorrow.     Follow Up Recommendations  Home health PT    Barriers to Discharge        Equipment Recommendations  3 in 1 bedside comode    Recommendations for Other Services    Frequency 7X/week   Plan Discharge plan remains appropriate;Frequency remains appropriate    Precautions / Restrictions Precautions Precautions: Anterior Hip Precaution Booklet Issued: Yes (comment) Restrictions Weight Bearing Restrictions: Yes RLE Weight Bearing: Weight bearing as tolerated   Pertinent Vitals/Pain Patient reports no pain in hip     Mobility  Bed Mobility Bed Mobility: Supine to Sit;Sit to Supine Supine to Sit: 5: Supervision;HOB flat Sit to Supine: 5: Supervision;HOB flat Details for Bed Mobility Assistance: cues for technique no physical assistance required.   Transfers Transfers: Sit to Stand;Stand to Sit Sit to Stand: 5: Supervision;6: Modified independent (Device/Increase time);From bed;With upper extremity assist;From chair/3-in-1 Stand to Sit: 5: Supervision;With upper extremity assist Details for Transfer Assistance: Cues for position of  R LE to minimize pain and maintain hip precautions.  Ambulation/Gait Ambulation/Gait Assistance: 6: Modified independent (Device/Increase time) Ambulation Distance (Feet): 150 Feet Assistive device: Rolling walker Ambulation/Gait Assistance Details: no instruction or assistance required.  No significant increase in pain.   Gait Pattern: Step-to pattern;Decreased stride length Stairs: Yes Stairs  Assistance: 5: Supervision;6: Modified independent (Device/Increase time) Stairs Assistance Details (indicate cue type and reason): Visual and verbal instruction on posterior and anterior techniques.  Pt able to perform both without assistance.  Stair Management Technique: With walker;Step to pattern Number of Stairs: 1  (5 trials ) Wheelchair Mobility Wheelchair Mobility: No    Exercises Total Joint Exercises Ankle Circles/Pumps: Both;10 reps;Supine Quad Sets: Both;10 reps;Seated Gluteal Sets: Both;10 reps;Seated Short Arc Quad: Both;10 reps;Supine;Strengthening Heel Slides: Right;10 reps;Supine Long Arc Quad: Right;10 reps;Seated Marching in Standing: Right;10 reps;Standing   PT Diagnosis:    PT Problem List:   PT Treatment Interventions:     PT Goals Acute Rehab PT Goals PT Goal Formulation: With patient/family Time For Goal Achievement: 01/20/12 Potential to Achieve Goals: Good Pt will go Supine/Side to Sit: with modified independence;with HOB 0 degrees PT Goal: Supine/Side to Sit - Progress: Progressing toward goal Pt will go Sit to Supine/Side: with modified independence PT Goal: Sit to Supine/Side - Progress: Progressing toward goal Pt will Ambulate: 51 - 150 feet;with modified independence;with least restrictive assistive device PT Goal: Ambulate - Progress: Met Pt will Perform Home Exercise Program: with supervision, verbal cues required/provided PT Goal: Perform Home Exercise Program - Progress: Progressing toward goal Additional Goals Additional Goal #1: Pt will verbalize 3/3 anteriror hip precautions.   PT Goal: Additional Goal #1 - Progress: Met  Visit Information  Last PT Received On: 01/13/12 Assistance Needed: +1    Subjective Data  Subjective: I feel pretty good.   Patient Stated Goal: walk without pain   Cognition  Overall Cognitive Status: Appears within functional limits for tasks assessed/performed Arousal/Alertness: Awake/alert Orientation Level:  Oriented X4 / Intact Behavior During Session: [facility] for tasks performed    Balance  Balance Balance Assessed:  No  End of Session PT - End of Session Equipment Utilized During Treatment: Gait belt Activity Tolerance: Patient tolerated treatment well Patient left: in chair;with call bell/phone within reach Nurse Communication: Mobility status   GP     Arisha Gervais 01/13/2012, 3:47 PM Sailor Haughn L. Vollie Brunty DPT [phone]

## 2012-01-14 LAB — CBC
MCH: 30.9 pg (ref 26.0–34.0)
MCHC: 34.3 g/dL (ref 30.0–36.0)
MCV: 90.3 fL (ref 78.0–100.0)
Platelets: 129 10*3/uL — ABNORMAL LOW (ref 150–400)
RBC: 3.2 MIL/uL — ABNORMAL LOW (ref 4.22–5.81)
RDW: 13.2 % (ref 11.5–15.5)

## 2012-01-14 MED ORDER — METHOCARBAMOL 500 MG PO TABS: 500.0000 mg | ORAL_TABLET | Freq: Four times a day (QID) | ORAL | Status: AC | PRN

## 2012-01-14 MED ORDER — ASPIRIN 325 MG PO TBEC: 325.0000 mg | DELAYED_RELEASE_TABLET | Freq: Two times a day (BID) | ORAL | Status: AC

## 2012-01-14 MED ORDER — CELECOXIB 200 MG PO CAPS: 200.0000 mg | ORAL_CAPSULE | Freq: Two times a day (BID) | ORAL | Status: AC

## 2012-01-14 MED ORDER — OXYCODONE HCL 5 MG PO TABS: 5.0000 mg | ORAL_TABLET | ORAL | Status: AC | PRN

## 2012-01-14 NOTE — Evaluation (Signed)
Occupational Therapy Evaluation Patient Details Name: Damon Morgan MRN: [id number] DOB: 1935-10-28 Today's Date: 01/14/2012 Time: 7829-5621 OT Time Calculation (min): 18 min  OT Assessment / Plan / Recommendation Clinical Impression  This 76 y.o. male admitted for Lt. anterior THA.  Pt. demonstrates understanding of anterior THA.  Pt. able to perform LB ADLs with supervision and increased time and effort.  He has all DME.  No further OT needs identified.  Will sign off.    OT Assessment  Patient does not need any further OT services    Follow Up Recommendations  No OT follow up;Supervision - Intermittent    Barriers to Discharge      Equipment Recommendations  3 in 1 bedside comode    Recommendations for Other Services    Frequency       Precautions / Restrictions Precautions Precautions: Anterior Hip Precaution Booklet Issued: Yes (comment) Restrictions Weight Bearing Restrictions: Yes RLE Weight Bearing: Weight bearing as tolerated       ADL  Eating/Feeding: Simulated;Independent Where Assessed - Eating/Feeding: Chair Grooming: Simulated;Wash/dry hands;Wash/dry face;Teeth care;Brushing hair;Supervision/safety Where Assessed - Grooming: Supported standing Upper Body Bathing: Simulated;Set up Where Assessed - Upper Body Bathing: Supported sitting Lower Body Bathing: Simulated;Supervision/safety Where Assessed - Lower Body Bathing: Supported sit to stand Upper Body Dressing: Simulated;Set up Where Assessed - Upper Body Dressing: Unsupported sitting Lower Body Dressing: Simulated;Supervision/safety Where Assessed - Lower Body Dressing: Supported sit to stand Transfers/Ambulation Related to ADLs: Pt. reports he has been ambulating into the bathroom with supervision ADL Comments: Pt. able to state anterior THA precautions.  pt. able to don/doff socks for bil. LEs.  Discussed this is how he will perform LB bathing and dressing at home.  He has a 3-in-1 for home use, and has  a built in shower seat at home    OT Diagnosis:    OT Problem List:   OT Treatment Interventions:     OT Goals    Visit Information  Last OT Received On: 01/14/12 Assistance Needed: +1    Subjective Data  Subjective: "I was just wondering how I was going to do it"  re: LB dressing Patient Stated Goal: To get back to normal   Prior Functioning  Vision/Perception  Home Living Lives With: Spouse Available Help at Discharge: Family Type of Home: House Home Access: Ramped entrance Home Layout: Two level;Able to live on main level with bedroom/bathroom Alternate Level Stairs-Number of Steps: 3 Alternate Level Stairs-Rails: Can reach both Bathroom Shower/Tub: Walk-in shower;Door [employer] Toilet: Standard Bathroom Accessibility: Yes How Accessible: Accessible via walker;Accessible via wheelchair Home Adaptive Equipment: Built-in shower seat;Grab bars in shower;Grab bars around toilet;Hand-held shower hose;Straight cane;Walker - rolling;Walker - four wheeled;Walker - standard;Wheelchair - manual Prior Function Level of Independence: Independent Able to Take Stairs?: Yes Driving: Yes 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 difficulties Dominant Hand: Right      Cognition  Overall Cognitive Status: Appears within functional limits for tasks assessed/performed Arousal/Alertness: Awake/alert Behavior During Session: [facility] for tasks performed    Extremity/Trunk Assessment Right Upper Extremity Assessment RUE ROM/Strength/Tone: Within functional levels Left Upper Extremity Assessment LUE ROM/Strength/Tone: Within functional levels   Mobility Transfers Sit to Stand: 7: Independent Stand to Sit: 7: Independent   Exercise    Balance    End of Session OT - End of Session Activity Tolerance: Patient tolerated treatment well Patient left: in chair;with call bell/phone within reach  GO     Kristell Wooding M 01/14/2012, 2:40 PM

## 2012-01-14 NOTE — Progress Notes (Signed)
Subjective: 2 Days Post-Op Procedure(s) (LRB): TOTAL HIP ARTHROPLASTY ANTERIOR APPROACH (Right)  Activity level:  walking Diet tolerance:  ok Voiding:  ok Patient reports pain as 0 on 0-10 scale.    Objective: Vital signs in last 24 hours: Temp:  [98.4 F (36.9 C)-100.2 F (37.9 C)] 99 F (37.2 C) (07/18 0538) Pulse Rate:  [58-80] 61  (07/18 0538) Resp:  [18-19] 18  (07/18 0538) BP: (110-145)/(58-65) 110/59 mmHg (07/18 0538) SpO2:  [93 %-98 %] 96 % (07/18 0538)  Labs:  Basename 01/14/12 0630 01/13/12 0520  HGB 9.9* 11.1*    Basename 01/14/12 0630 01/13/12 0520  WBC 5.5 6.4  RBC 3.20* 3.48*  HCT 28.9* 32.4*  PLT 129* 144*    Basename 01/13/12 0520  NA 141  K 4.7  CL 106  CO2 27  BUN 15  CREATININE 0.80  GLUCOSE 136*  CALCIUM 8.2*   No results found for this basename: LABPT:2,INR:2 in the last 72 hours  Physical Exam:  Neurologically intact ABD soft Neurovascular intact Sensation intact distally Intact pulses distally Dorsiflexion/Plantar flexion intact No cellulitis present Compartment soft Dressing changed -wound wnl  Assessment/Plan:  2 Days Post-Op Procedure(s) (LRB): TOTAL HIP ARTHROPLASTY ANTERIOR APPROACH (Right) Advance diet Up with therapy D/C IV fluids Discharge home with home health ASA 325 bid x 30 days    Majd Tissue R 01/14/2012, 8:40 AM

## 2012-01-14 NOTE — Care Management Note (Signed)
    Page 1 of 2   01/14/2012     10:19:45 AM   CARE MANAGEMENT NOTE 01/14/2012  Patient:  Damon Morgan, Damon Morgan   Account Number:  [id number]  Date Initiated:  01/13/2012  Documentation initiated by:  Anette Guarneri  Subjective/Objective Assessment:   POD#1 S/P right THA     Action/Plan:   HH services and DME as needed   Anticipated DC Date:  01/14/2012   Anticipated DC Plan:  HOME W HOME HEALTH SERVICES      DC Planning Services  CM consult      Choice offered to / List presented to:  C-1 Patient   DME arranged  3-N-1  Levan Hurst      DME agency  [facility]        Status of service:  Completed, signed off Medicare Important Message given?   (If response is "NO", the following Medicare IM given date fields will be blank) Date Medicare IM given:   Date Additional Medicare IM given:    Discharge Disposition:  HOME W HOME HEALTH SERVICES  Per UR Regulation:  Reviewed for med. necessity/level of care/duration of stay  If discussed at Long Length of Stay Meetings, dates discussed:    Comments:  01/14/12  Anette Guarneri RN/CM Per patient, he does not have RW, need 3n1 Contacted [facility] for DME to be delivered to room prior to d/c.    01/13/12  11:41 Anette Guarneri RN/CM spoke with patient and wife regarding d/c planning patient plans to d/c home with wife, has RW at home, will probably need 3n1 PT/OT eval pending Patient choice for [facility] services if needed is [facility] This CM will continue to follow for PT/OT recommendations.

## 2012-01-14 NOTE — Discharge Summary (Signed)
Patient ID: Damon Morgan MRN: [id number] DOB/AGE: 1935-09-29 76 y.o.  Admit date: 01/12/2012 Discharge date: 01/14/2012  Admission Diagnoses:  Principal Problem:  *DJD (degenerative joint disease) of hip   Discharge Diagnoses:  Same  Past Medical History  Diagnosis Date  . No pertinent past medical history   . Arthritis     Surgeries: Procedure(s): TOTAL HIP ARTHROPLASTY ANTERIOR APPROACH on 01/12/2012   Consultants:    Discharged Condition: Improved  Hospital Course: Damon Morgan is an 76 y.o. male who was admitted 01/12/2012 for operative treatment ofDJD (degenerative joint disease) of hip. Patient has severe unremitting pain that affects sleep, daily activities, and work/hobbies. After pre-op clearance the patient was taken to the operating room on 01/12/2012 and underwent  Procedure(s): TOTAL HIP ARTHROPLASTY ANTERIOR APPROACH.    Patient was given perioperative antibiotics: Anti-infectives     Start     Dose/Rate Route Frequency Ordered Stop   01/12/12 2200   ceFAZolin (ANCEF) IVPB 2 g/50 mL premix        2 g 100 mL/hr over 30 Minutes Intravenous Every 6 hours 01/12/12 2150 01/23/2012 0533   01/12/12 0500   ceFAZolin (ANCEF) IVPB 2 g/50 mL premix        2 g 100 mL/hr over 30 Minutes Intravenous 60 min pre-op 01/11/12 1434 01/12/12 1659           Patient was given sequential compression devices, early ambulation, and chemoprophylaxis to prevent DVT. Will cont ASA 325 mg BID x 30 days  Patient benefited maximally from hospital stay and there were no complications.    Recent vital signs: Patient Vitals for the past 24 hrs:  BP Temp Pulse Resp SpO2  01/14/12 0538 110/59 mmHg 99 F (37.2 C) 61  18  96 %  January 23, 2012 2146 111/58 mmHg 100.2 F (37.9 C) 80  19  93 %  Jan 23, 2012 1433 145/65 mmHg 98.4 F (36.9 C) 58  18  98 %     Recent laboratory studies:  Basename 01/14/12 0630 2012-01-23 0520  WBC 5.5 6.4  HGB 9.9* 11.1*  HCT 28.9* 32.4*  PLT 129* 144*  NA -- 141   K -- 4.7  CL -- 106  CO2 -- 27  BUN -- 15  CREATININE -- 0.80  GLUCOSE -- 136*  INR -- --  CALCIUM -- 8.2*     Discharge Medications:   Medication List  As of 01/14/2012  8:48 AM   TAKE these medications         aspirin 325 MG EC tablet   Take 1 tablet (325 mg total) by mouth 2 (two) times daily.      celecoxib 200 MG capsule   Commonly known as: CELEBREX   Take 1 capsule (200 mg total) by mouth every 12 (twelve) hours.      methocarbamol 500 MG tablet   Commonly known as: ROBAXIN   Take 1 tablet (500 mg total) by mouth every 6 (six) hours as needed.      oxyCODONE 5 MG immediate release tablet   Commonly known as: Oxy IR/ROXICODONE   Take 1-2 tablets (5-10 mg total) by mouth every 3 (three) hours as needed.            Diagnostic Studies: Dg Chest 2 View  01/06/2012  *RADIOLOGY REPORT*  Clinical Data: Preoperative respiratory exam.  Osteoarthritis of the right hip.  CHEST - 2 VIEW  Comparison: None.  Findings: The heart size and pulmonary vascularity are normal and the lungs  are clear.  No acute osseous abnormality.  Fairly severe arthritic changes are visible in the shoulder joints.  IMPRESSION: No acute disease in the chest.  Original Report Authenticated By: Gwynn Burly, M.D.   Dg Hip Operative Right  01/12/2012  *RADIOLOGY REPORT*  Clinical Data: Right hip replacement.  DG OPERATIVE RIGHT HIP  Comparison: 12/07/2008.  Findings: Two intraoperative frontal views of the right hip submitted for review after surgery.  This reveals satisfactory position of the total right hip prosthesis without complication.  IMPRESSION: Post right hip replacement.  Original Report Authenticated By: Fuller Canada, M.D.   Dg Pelvis Portable  01/12/2012  *RADIOLOGY REPORT*  Clinical Data: Right hip replacement.  PORTABLE PELVIS  Comparison: 12/07/2008.  Findings: Total right hip replacement appears in satisfactory position on this single projection without complication noted.   IMPRESSION: Total right hip replacement appears in satisfactory position on this single projection without complication noted.  Original Report Authenticated By: Fuller Canada, M.D.    Disposition: Final discharge disposition not confirmed  Discharge Orders    Future Orders Please Complete By Expires   Diet - low sodium heart healthy      Call MD / Call 911      Comments:   If you experience chest pain or shortness of breath, CALL 911 and be transported to the hospital emergency room.  If you develope a fever above 101 F, pus (white drainage) or increased drainage or redness at the wound, or calf pain, call your surgeon's office.   Constipation Prevention      Comments:   Drink plenty of fluids.  Prune juice may be helpful.  You may use a stool softener, such as Colace (over the counter) 100 mg twice a day.  Use MiraLax (over the counter) for constipation as needed.   Increase activity slowly as tolerated         Follow-up Information    Follow up with Velna Ochs, MD in 12 days.   Contact information:   [address] [place] [address] [phone]           Signed: Prince Rome 01/14/2012, 8:48 AM

## 2012-01-14 NOTE — Progress Notes (Signed)
Physical Therapy Treatment Patient Details Name: Damon Morgan MRN: [id number] DOB: 1936-06-02 Today's Date: 01/14/2012 Time: 4098-1191 PT Time Calculation (min): 15 min  PT Assessment / Plan / Recommendation Comments on Treatment Session  D/c to home today.  Reviewed HEP and Anterior hip precautions with pt.      Follow Up Recommendations  Home health PT    Barriers to Discharge        Equipment Recommendations  3 in 1 bedside comode    Recommendations for Other Services    Frequency     Plan All goals met and education completed, patient dischaged from PT services    Precautions / Restrictions Precautions Precautions: Anterior Hip Precaution Booklet Issued: Yes (comment) Restrictions Weight Bearing Restrictions: Yes RLE Weight Bearing: Weight bearing as tolerated   Pertinent Vitals/Pain No c/o pain    Mobility  Bed Mobility Bed Mobility: Supine to Sit Supine to Sit: 7: Independent Sit to Supine: 7: Independent Transfers Transfers: Sit to Stand;Stand to Sit Sit to Stand: 7: Independent Stand to Sit: 7: Independent Ambulation/Gait Ambulation/Gait Assistance: 6: Modified independent (Device/Increase time) Ambulation Distance (Feet): 200 Feet Assistive device: Rolling walker Gait Pattern: Within Functional Limits Stairs: No Wheelchair Mobility Wheelchair Mobility: No    Exercises Total Joint Exercises Ankle Circles/Pumps: Both;10 reps;Supine Quad Sets: Both;10 reps;Seated   PT Diagnosis:    PT Problem List:   PT Treatment Interventions:     PT Goals Acute Rehab PT Goals PT Goal Formulation: With patient/family Time For Goal Achievement: 01/20/12 Potential to Achieve Goals: Good Pt will go Supine/Side to Sit: with modified independence;with HOB 0 degrees PT Goal: Supine/Side to Sit - Progress: Met Pt will go Sit to Supine/Side: with modified independence PT Goal: Sit to Supine/Side - Progress: Met Pt will Ambulate: 51 - 150 feet;with modified  independence;with least restrictive assistive device PT Goal: Ambulate - Progress: Met Pt will Perform Home Exercise Program: with supervision, verbal cues required/provided PT Goal: Perform Home Exercise Program - Progress: Met Additional Goals Additional Goal #1: Pt will verbalize 3/3 anteriror hip precautions.   PT Goal: Additional Goal #1 - Progress: Met  Visit Information  Last PT Received On: 01/14/12 Assistance Needed: +1    Subjective Data  Subjective: I got up and walked again last night. Patient Stated Goal: Work out at the [employer] Status: Appears within functional limits for tasks assessed/performed Arousal/Alertness: Awake/alert Orientation Level: Oriented X4 / Intact Behavior During Session: [facility] for tasks performed    Balance  Balance Balance Assessed: No  End of Session PT - End of Session Activity Tolerance: Patient tolerated treatment well Patient left: in chair;with call bell/phone within reach Nurse Communication: Mobility status   GP     Taeko Schaffer 01/14/2012, 9:08 AM Theron Arista L. Kayti Poss DPT [phone]

## 2012-01-15 ENCOUNTER — Encounter (HOSPITAL_COMMUNITY): Payer: Self-pay | Admitting: Orthopaedic Surgery

## 2012-01-15 LAB — POCT I-STAT 4, (NA,K, GLUC, HGB,HCT): Glucose, Bld: 116 mg/dL — ABNORMAL HIGH (ref 70–99)

## 2013-11-30 ENCOUNTER — Encounter (HOSPITAL_COMMUNITY): Payer: Self-pay | Admitting: Emergency Medicine

## 2013-11-30 ENCOUNTER — Emergency Department (HOSPITAL_COMMUNITY): Payer: Medicare Other

## 2013-11-30 ENCOUNTER — Emergency Department (HOSPITAL_COMMUNITY)
Admission: EM | Admit: 2013-11-30 | Discharge: 2013-11-30 | Disposition: A | Discharge status: Home / Self Care | Payer: Medicare Other | Attending: Emergency Medicine | Admitting: Emergency Medicine

## 2013-11-30 DIAGNOSIS — Y939 Activity, unspecified: Secondary | ICD-10-CM | POA: Insufficient documentation

## 2013-11-30 DIAGNOSIS — S61219A Laceration without foreign body of unspecified finger without damage to nail, initial encounter: Secondary | ICD-10-CM

## 2013-11-30 DIAGNOSIS — Z8739 Personal history of other diseases of the musculoskeletal system and connective tissue: Secondary | ICD-10-CM | POA: Insufficient documentation

## 2013-11-30 DIAGNOSIS — Z87891 Personal history of nicotine dependence: Secondary | ICD-10-CM | POA: Insufficient documentation

## 2013-11-30 DIAGNOSIS — W108XXA Fall (on) (from) other stairs and steps, initial encounter: Secondary | ICD-10-CM | POA: Insufficient documentation

## 2013-11-30 DIAGNOSIS — Z23 Encounter for immunization: Secondary | ICD-10-CM | POA: Insufficient documentation

## 2013-11-30 DIAGNOSIS — S61209A Unspecified open wound of unspecified finger without damage to nail, initial encounter: Secondary | ICD-10-CM | POA: Insufficient documentation

## 2013-11-30 DIAGNOSIS — Y929 Unspecified place or not applicable: Secondary | ICD-10-CM | POA: Insufficient documentation

## 2013-11-30 MED ORDER — TETANUS-DIPHTH-ACELL PERTUSSIS 5-2.5-18.5 LF-MCG/0.5 IM SUSP
0.5000 mL | Freq: Once | INTRAMUSCULAR | Status: AC
  Administered 2013-11-30: 0.5 mL via INTRAMUSCULAR
  Filled 2013-11-30: qty 0.5

## 2013-11-30 NOTE — ED Provider Notes (Signed)
Medical screening examination/treatment/procedure(s) were performed by non-physician practitioner and as supervising physician I was immediately available for consultation/collaboration.   EKG Interpretation None        Osvaldo Shipper, MD 11/30/13 [phone]

## 2013-11-30 NOTE — ED Provider Notes (Signed)
CSN: [id number]     Arrival date & time 11/30/13  0277 History   First MD Initiated Contact with Patient 11/30/13 1005     Chief Complaint  Patient presents with  . Extremity Laceration     (Consider location/radiation/quality/duration/timing/severity/associated sxs/prior Treatment) HPI Comments: Patient is a right hand dominant 78 year old male with history of arthritis who presents today for evaluation of laceration to left fifth finger. He states that prior to arrival he was holding a can of cat food when he went to throw it out. He missed a step and fell onto the can. There are no other injuries during the fall. He did not hit his head or lose consciousness. He has minimal pain to the area. Sensation is intact. He has full range of motion of his hand. He is unsure when his last tetanus shot was, but loosens up to date. He denies diabetes. He is not on any blood thinners.  The history is provided by the patient. No language interpreter was used.    Past Medical History  Diagnosis Date  . No pertinent past medical history   . Arthritis    Past Surgical History  Procedure Laterality Date  . Inguinal hernia repair  1999    lt  . Tonsillectomy    . Colonoscopy    . Arthroplasty  01/12/2012    rt hip  . Total hip arthroplasty  01/12/2012    Procedure: TOTAL HIP ARTHROPLASTY ANTERIOR APPROACH;  Surgeon: Hessie Dibble, MD;  Location: [place];  Service: Orthopedics;  Laterality: Right;   History reviewed. No pertinent family history. History  Substance Use Topics  . Smoking status: Former Smoker    Types: Cigarettes    Quit date: 12/24/1974  . Smokeless tobacco: Never Used  . Alcohol Use: Yes     Comment: occ    Review of Systems  Musculoskeletal: Negative for arthralgias and myalgias.  Skin: Positive for wound.  Neurological: Negative for headaches.  All other systems reviewed and are negative.     Allergies  Review of patient's allergies indicates no known  allergies.  Home Medications   Prior to Admission medications   Not on File   BP 127/66  Pulse 51  Temp(Src) 98.9 F (37.2 C) (Oral)  Resp 18  SpO2 98% Physical Exam  Nursing note and vitals reviewed. Constitutional: He is oriented to person, place, and time. He appears well-developed and well-nourished. No distress.  HENT:  Head: Normocephalic and atraumatic.  Right Ear: External ear normal.  Left Ear: External ear normal.  Nose: Nose normal.  Eyes: Conjunctivae are normal.  Neck: Normal range of motion. No tracheal deviation present.  Cardiovascular: Normal rate, regular rhythm, normal heart sounds, intact distal pulses and normal pulses.   Pulses:      Radial pulses are 2+ on the right side, and 2+ on the left side.  Pulmonary/Chest: Effort normal and breath sounds normal. No stridor.  Abdominal: Soft. He exhibits no distension. There is no tenderness.  Musculoskeletal: Normal range of motion.  Flexion and extension tested against resistance in left fifth finger. Both are 5 out of 5 strength.  Neurological: He is alert and oriented to person, place, and time.  Sensation to left fifth finger is intact.  Skin: Skin is warm and dry. He is not diaphoretic.  2 cm laceration to base of fifth left finger on the palmar side  Psychiatric: He has a normal mood and affect. His behavior is normal.    ED  Course  Procedures (including critical care time) Labs Review Labs Reviewed - No data to display  Imaging Review No results found.   EKG Interpretation None      LACERATION REPAIR Performed by: Elwyn Lade Authorized by: Elwyn Lade Consent: Verbal consent obtained. Risks and benefits: risks, benefits and alternatives were discussed Consent given by: patient Patient identity confirmed: provided demographic data Prepped and Draped in normal sterile fashion Wound explored  Laceration Location: left 5th finger Laceration Length: 2 cm  No Foreign Bodies seen  or palpated  Anesthesia: local infiltration  Local anesthetic: lidocaine 2%   Anesthetic total: 3 ml  Irrigation method: syringe Amount of cleaning: standard  Skin closure: 4-0 Ethilon  Number of sutures: 3  Technique: simple interrupted   Patient tolerance: Patient tolerated the procedure well with no immediate complications.   MDM   Final diagnoses:  Finger laceration    Tdap booster given. Wound cleaning complete with pressure irrigation, bottom of wound visualized, no foreign bodies appreciated. Laceration occurred < 8 hours prior to repair which was well tolerated. Pt has no co morbidities to effect normal wound healing. Discussed suture home care w pt and answered questions. Pt to f-u for wound check and suture removal in 7 days. Patient was additionally given a finger splint due to the location of the laceration. He was given follow up with hand surgery if his condition worsens despite currently having 5/5 strength in flexor tendon, intact sensation, and normal capillary refill. Pt is hemodynamically stable w no complaints prior to dc.      Elwyn Lade, PA-C 11/30/13 1153

## 2013-11-30 NOTE — ED Notes (Signed)
Pt cut left hand on cat food can this morning.

## 2013-11-30 NOTE — Discharge Instructions (Signed)
Laceration Care, Adult °A laceration is a cut that goes through all layers of the skin. The cut goes into the tissue beneath the skin. °HOME CARE °For stitches (sutures) or staples: °· Keep the cut clean and dry. °· If you have a bandage (dressing), change it at least once a day. Change the bandage if it gets wet or dirty, or as told by your doctor. °· Wash the cut with soap and water 2 times a day. Rinse the cut with water. Pat it dry with a clean towel. °· Put a thin layer of medicated cream on the cut as told by your doctor. °· You may shower after the first 24 hours. Do not soak the cut in water until the stitches are removed. °· Only take medicines as told by your doctor. °· Have your stitches or staples removed as told by your doctor. °For skin adhesive strips: °· Keep the cut clean and dry. °· Do not get the strips wet. You may take a bath, but be careful to keep the cut dry. °· If the cut gets wet, pat it dry with a clean towel. °· The strips will fall off on their own. Do not remove the strips that are still stuck to the cut. °For wound glue: °· You may shower or take baths. Do not soak or scrub the cut. Do not swim. Avoid heavy sweating until the glue falls off on its own. After a shower or bath, pat the cut dry with a clean towel. °· Do not put medicine on your cut until the glue falls off. °· If you have a bandage, do not put tape over the glue. °· Avoid lots of sunlight or tanning lamps until the glue falls off. Put sunscreen on the cut for the first year to reduce your scar. °· The glue will fall off on its own. Do not pick at the glue. °You may need a tetanus shot if: °· You cannot remember when you had your last tetanus shot. °· You have never had a tetanus shot. °If you need a tetanus shot and you choose not to have one, you may get tetanus. Sickness from tetanus can be serious. °GET HELP RIGHT AWAY IF:  °· Your pain does not get better with medicine. °· Your arm, hand, leg, or foot loses feeling  (numbness) or changes color. °· Your cut is bleeding. °· Your joint feels weak, or you cannot use your joint. °· You have painful lumps on your body. °· Your cut is red, puffy (swollen), or painful. °· You have a red line on the skin near the cut. °· You have yellowish-white fluid (pus) coming from the cut. °· You have a fever. °· You have a bad smell coming from the cut or bandage. °· Your cut breaks open before or after stitches are removed. °· You notice something coming out of the cut, such as wood or glass. °· You cannot move a finger or toe. °MAKE SURE YOU:  °· Understand these instructions. °· Will watch your condition. °· Will get help right away if you are not doing well or get worse. °Document Released: 12/02/2007 Document Revised: 09/07/2011 Document Reviewed: 12/09/2010 °Exit[employer]® Patient Information ©2014 [employer]. ° ° ° ° °

## 2013-12-01 ENCOUNTER — Emergency Department (HOSPITAL_COMMUNITY)
Admission: EM | Admit: 2013-12-01 | Discharge: 2013-12-01 | Disposition: A | Discharge status: Home / Self Care | Payer: Medicare Other | Attending: Emergency Medicine | Admitting: Emergency Medicine

## 2013-12-01 ENCOUNTER — Encounter (HOSPITAL_COMMUNITY): Payer: Self-pay | Admitting: Emergency Medicine

## 2013-12-01 DIAGNOSIS — Z87891 Personal history of nicotine dependence: Secondary | ICD-10-CM | POA: Insufficient documentation

## 2013-12-01 DIAGNOSIS — Z4801 Encounter for change or removal of surgical wound dressing: Secondary | ICD-10-CM | POA: Insufficient documentation

## 2013-12-01 DIAGNOSIS — Z8739 Personal history of other diseases of the musculoskeletal system and connective tissue: Secondary | ICD-10-CM | POA: Insufficient documentation

## 2013-12-01 DIAGNOSIS — Z5189 Encounter for other specified aftercare: Secondary | ICD-10-CM

## 2013-12-01 NOTE — ED Provider Notes (Signed)
CSN: [id number]     Arrival date & time 12/01/13  1227 History  This chart was scribed for non-physician practitioner, Delos Haring, PA-C,working with Ephraim Hamburger, MD, by Marlowe Kays, ED Scribe.  This patient was seen in room WTR5/WTR5 and the patient's care was started at 12:40 PM.  Chief Complaint  Patient presents with  . Suture Problem    The history is provided by the patient. No language interpreter was used.   HPI Comments:  RAMONA SLINGER is a 78 y.o. male who presents to the Emergency Department complaining of a laceration to the palmar aspect of his left hand at the base of his 5th digit that happened yesterday. He states he was holding an open can of cat food and fell with it in his hand. Pt states he was here yesterday and received sutures but states that one of them came loose earlier today. He denies any pain, purulent drainage, or fever. His PCP is Dr. Lavone Orn.  Past Medical History  Diagnosis Date  . No pertinent past medical history   . Arthritis    Past Surgical History  Procedure Laterality Date  . Inguinal hernia repair  1999    lt  . Tonsillectomy    . Colonoscopy    . Arthroplasty  01/12/2012    rt hip  . Total hip arthroplasty  01/12/2012    Procedure: TOTAL HIP ARTHROPLASTY ANTERIOR APPROACH;  Surgeon: Hessie Dibble, MD;  Location: [place];  Service: Orthopedics;  Laterality: Right;   History reviewed. No pertinent family history. History  Substance Use Topics  . Smoking status: Former Smoker    Types: Cigarettes    Quit date: 12/24/1974  . Smokeless tobacco: Never Used  . Alcohol Use: Yes     Comment: occ    Review of Systems  Constitutional: Negative for fever.  Skin: Positive for wound (laceration to left hand).  All other systems reviewed and are negative.   Allergies  Review of patient's allergies indicates no known allergies.  Home Medications   Prior to Admission medications   Not on File   BP 133/77  Pulse 54   Temp(Src) 97.6 F (36.4 C) (Oral)  Resp 16 Physical Exam  Nursing note and vitals reviewed. Constitutional: He is oriented to person, place, and time. He appears well-developed and well-nourished.  HENT:  Head: Normocephalic and atraumatic.  Eyes: EOM are normal.  Neck: Normal range of motion.  Cardiovascular: Normal rate.   Pulmonary/Chest: Effort normal.  Musculoskeletal: Normal range of motion.  Neurological: He is alert and oriented to person, place, and time.  Skin: Skin is warm and dry.  Wound to base of left 5th finger. 2 intact sutures. 1 suture missing. Wound edges continue to be approximated. No purulent drainage or induration. No tenderness. Full ROM of joint.  Psychiatric: He has a normal mood and affect. His behavior is normal.    ED Course  Procedures (including critical care time) COORDINATION OF CARE: 12:47 PM- Will re-wrap wound and immobilize finger better. Pt needs to let wound get more air as it is too moist. Advised to follow up with his PCP for a recheck in a couple of days Monday or Tuesday. Pt verbalizes understanding and agrees to plan.  Medications - No data to display  Labs Review Labs Reviewed - No data to display  Imaging Review Dg Finger Little Left  11/30/2013   CLINICAL DATA:  Small finger injury with laceration over the MCP joint.  EXAM: LEFT LITTLE FINGER 2+V  COMPARISON:  None.  FINDINGS: Anatomic alignment of the small finger. There is no fracture or radiopaque foreign body. Mild DIP joint osteoarthritis. Incidental visualization the ring finger also appears normal.  IMPRESSION: Negative.   Electronically Signed   By: Dereck Ligas M.D.   On: 11/30/2013 10:25     EKG Interpretation None      MDM   Final diagnoses:  Visit for wound check    78 y.o.Jesusmanuel Erbes Moler's evaluation in the Emergency Department is complete. It has been determined that no acute conditions requiring further emergency intervention are present at this time. The  patient/guardian have been advised of the diagnosis and plan. We have discussed signs and symptoms that warrant return to the ED, such as changes or worsening in symptoms.  Vital signs are stable at discharge. Filed Vitals:   12/01/13 1233  BP: 133/77  Pulse: 54  Temp: 97.6 F (36.4 C)  Resp: 16    Patient/guardian has voiced understanding and agreed to follow-up with the PCP or specialist.   I personally performed the services described in this documentation, which was scribed in my presence. The recorded information has been reviewed and is accurate.  I    Linus Mako, PA-C 12/01/13 1255

## 2013-12-01 NOTE — Discharge Instructions (Signed)
Sutured Wound Care Sutures are stitches that can be used to close wounds. Wound care helps prevent pain and infection.  HOME CARE INSTRUCTIONS   Rest and elevate the injured area until all the pain and swelling are gone.  Only take over-the-counter or prescription medicines for pain, discomfort, or fever as directed by your caregiver.  After 48 hours, gently wash the area with mild soap and water once a day, or as directed. Rinse off the soap. Pat the area dry with a clean towel. Do not rub the wound. This may cause bleeding.  Follow your caregiver's instructions for how often to change the bandage (dressing). Stop using a dressing after 2 days or after the wound stops draining.  If the dressing sticks, moisten it with soapy water and gently remove it.  Apply ointment on the wound as directed.  Avoid stretching a sutured wound.  Drink enough fluids to keep your urine clear or pale yellow.  Follow up with your caregiver for suture removal as directed.  Use sunscreen on your wound for the next 3 to 6 months so the scar will not darken. SEEK IMMEDIATE MEDICAL CARE IF:   Your wound becomes red, swollen, hot, or tender.  You have increasing pain in the wound.  You have a red streak that extends from the wound.  There is pus coming from the wound.  You have a fever.  You have shaking chills.  There is a bad smell coming from the wound.  You have persistent bleeding from the wound. MAKE SURE YOU:   Understand these instructions.  Will watch your condition.  Will get help right away if you are not doing well or get worse. Document Released: 07/23/2004 Document Revised: 09/07/2011 Document Reviewed: 10/19/2010 [facility] Patient Information 2014 [place], [place].

## 2013-12-01 NOTE — ED Notes (Signed)
Pt alert, nad, resp even unlabored, skin pwd, denies needs, ambulates to discharge

## 2013-12-01 NOTE — ED Provider Notes (Signed)
Medical screening examination/treatment/procedure(s) were performed by non-physician practitioner and as supervising physician I was immediately available for consultation/collaboration.   EKG Interpretation None        Ephraim Hamburger, MD 12/01/13 [phone]

## 2013-12-01 NOTE — ED Notes (Addendum)
Pt presents w/ a loose suture in L hand.  Pt sts 3 sutures were placed yesterday and one came loose.  Denies pain.

## 2016-02-07 IMAGING — CR DG FINGER LITTLE 2+V*L*
3 series · 3 of 3 positions shown · non-contrast
Comparison: None.

CLINICAL DATA: Small finger injury with laceration over the MCP
joint.

EXAM:
LEFT LITTLE FINGER 2+V

[x finger pa left]
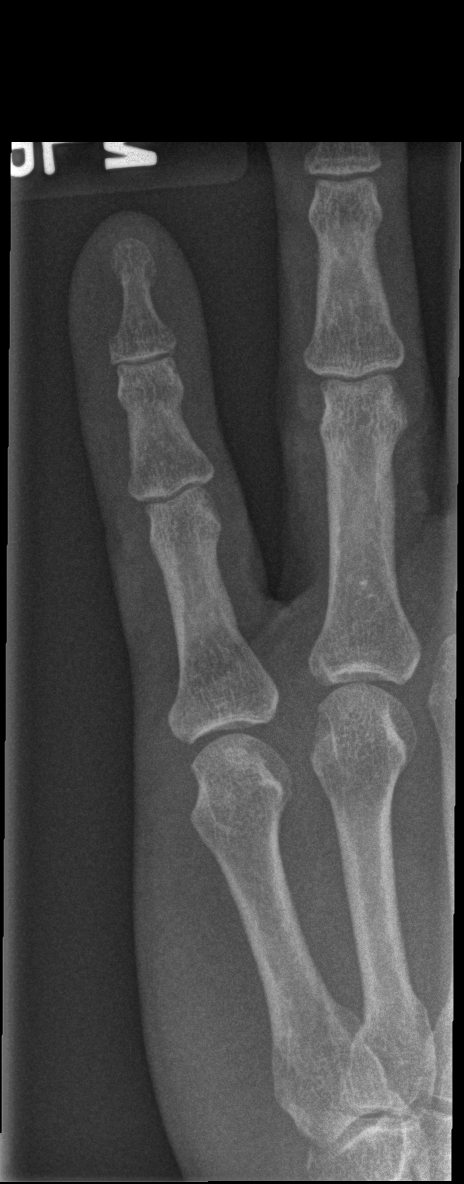

[x finger obl left]
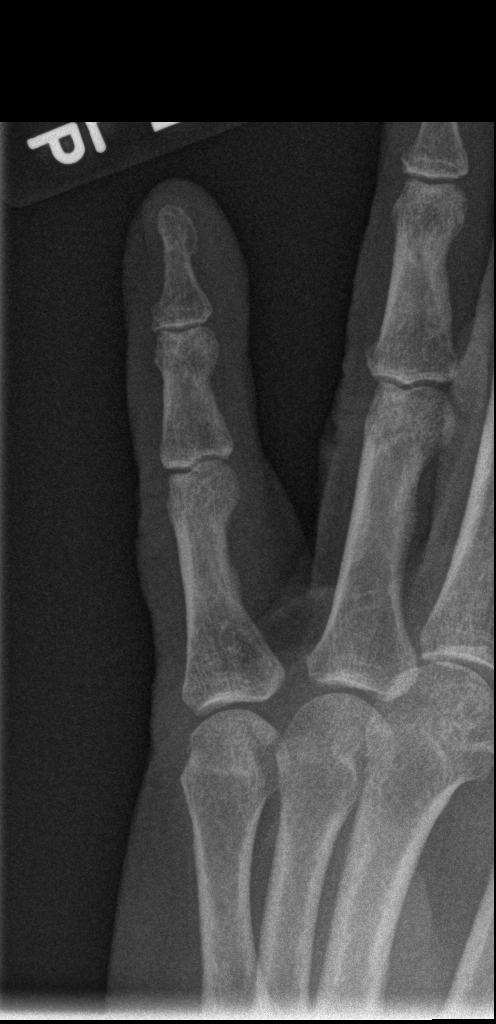

[x finger lat left]
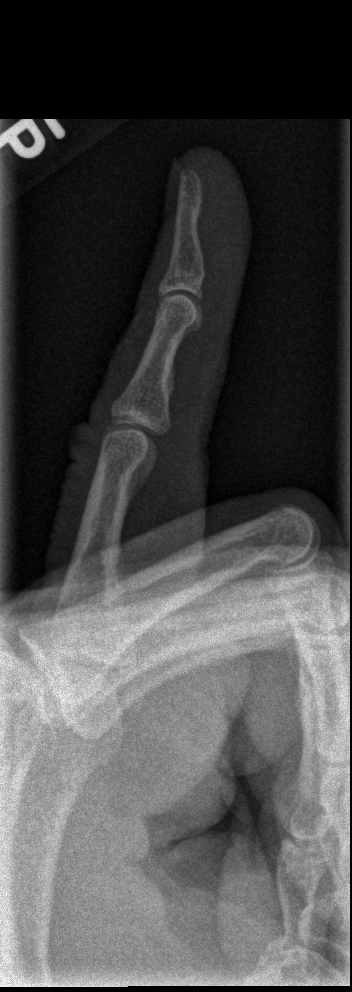

[3 of 3 positions shown; findings below may reference images not displayed]

FINDINGS: Anatomic alignment of the small finger. There is no fracture or
radiopaque foreign body. Mild DIP joint osteoarthritis. Incidental
visualization the ring finger also appears normal.
IMPRESSION: Negative.

## 2019-07-26 ENCOUNTER — Ambulatory Visit: Payer: Self-pay

## 2019-08-04 ENCOUNTER — Ambulatory Visit: Payer: Medicare Other | Attending: Internal Medicine

## 2019-08-04 DIAGNOSIS — Z23 Encounter for immunization: Secondary | ICD-10-CM | POA: Insufficient documentation

## 2019-08-04 NOTE — Progress Notes (Signed)
   Covid-19 Vaccination Clinic  Name:  KEYNER FINNIN    MRN: [id number] DOB: 12/30/1935  08/04/2019  Mr. Kellough was observed post Covid-19 immunization for 15 minutes without incidence. He was provided with Vaccine Information Sheet and instruction to access the V-Safe system.   Mr. Nessmith was instructed to call 911 with any severe reactions post vaccine: Marland Kitchen Difficulty breathing  . Swelling of your face and throat  . A fast heartbeat  . A bad rash all over your body  . Dizziness and weakness    Immunizations Administered    Name Date Dose VIS Date Route   Pfizer COVID-19 Vaccine 08/04/2019  9:55 AM 0.3 mL 06/09/2019 Intramuscular   Manufacturer: [place]   Lot: [id number]   [place]: [id number]

## 2019-08-29 ENCOUNTER — Ambulatory Visit: Payer: Medicare Other | Attending: Internal Medicine

## 2019-08-29 DIAGNOSIS — Z23 Encounter for immunization: Secondary | ICD-10-CM | POA: Insufficient documentation

## 2019-08-29 NOTE — Progress Notes (Signed)
   Covid-19 Vaccination Clinic  Name:  Damon Morgan    MRN: [id number] DOB: 07/22/1935  08/29/2019  Mr. Damon Morgan was observed post Covid-19 immunization for 15 minutes without incident. He was provided with Vaccine Information Sheet and instruction to access the V-Safe system.   Mr. Damon Morgan was instructed to call 911 with any severe reactions post vaccine: Marland Kitchen Difficulty breathing  . Swelling of face and throat  . A fast heartbeat  . A bad rash all over body  . Dizziness and weakness   Immunizations Administered    Name Date Dose VIS Date Route   Pfizer COVID-19 Vaccine 08/29/2019 10:31 AM 0.3 mL 06/09/2019 Intramuscular   Manufacturer: [place]   Lot: [id number]   [place]: [id number]

## 2020-10-21 DIAGNOSIS — M2550 Pain in unspecified joint: Secondary | ICD-10-CM | POA: Diagnosis not present

## 2020-10-21 DIAGNOSIS — M159 Polyosteoarthritis, unspecified: Secondary | ICD-10-CM | POA: Diagnosis not present

## 2020-10-21 DIAGNOSIS — M064 Inflammatory polyarthropathy: Secondary | ICD-10-CM | POA: Diagnosis not present

## 2020-10-21 DIAGNOSIS — R5383 Other fatigue: Secondary | ICD-10-CM | POA: Diagnosis not present

## 2020-11-12 DIAGNOSIS — M1A9XX1 Chronic gout, unspecified, with tophus (tophi): Secondary | ICD-10-CM | POA: Diagnosis not present

## 2020-11-12 DIAGNOSIS — M159 Polyosteoarthritis, unspecified: Secondary | ICD-10-CM | POA: Diagnosis not present

## 2020-11-12 DIAGNOSIS — M2550 Pain in unspecified joint: Secondary | ICD-10-CM | POA: Diagnosis not present

## 2020-12-10 DIAGNOSIS — M1A9XX1 Chronic gout, unspecified, with tophus (tophi): Secondary | ICD-10-CM | POA: Diagnosis not present

## 2020-12-17 DIAGNOSIS — M109 Gout, unspecified: Secondary | ICD-10-CM | POA: Diagnosis not present

## 2020-12-17 DIAGNOSIS — Z1389 Encounter for screening for other disorder: Secondary | ICD-10-CM | POA: Diagnosis not present

## 2020-12-17 DIAGNOSIS — Z0000 Encounter for general adult medical examination without abnormal findings: Secondary | ICD-10-CM | POA: Diagnosis not present

## 2020-12-17 DIAGNOSIS — J302 Other seasonal allergic rhinitis: Secondary | ICD-10-CM | POA: Diagnosis not present

## 2021-01-06 DIAGNOSIS — D1801 Hemangioma of skin and subcutaneous tissue: Secondary | ICD-10-CM | POA: Diagnosis not present

## 2021-01-06 DIAGNOSIS — L570 Actinic keratosis: Secondary | ICD-10-CM | POA: Diagnosis not present

## 2021-01-06 DIAGNOSIS — L720 Epidermal cyst: Secondary | ICD-10-CM | POA: Diagnosis not present

## 2021-01-06 DIAGNOSIS — L578 Other skin changes due to chronic exposure to nonionizing radiation: Secondary | ICD-10-CM | POA: Diagnosis not present

## 2021-01-13 DIAGNOSIS — M159 Polyosteoarthritis, unspecified: Secondary | ICD-10-CM | POA: Diagnosis not present

## 2021-01-13 DIAGNOSIS — M1A9XX1 Chronic gout, unspecified, with tophus (tophi): Secondary | ICD-10-CM | POA: Diagnosis not present

## 2021-01-13 DIAGNOSIS — M2550 Pain in unspecified joint: Secondary | ICD-10-CM | POA: Diagnosis not present

## 2021-02-04 DIAGNOSIS — H35373 Puckering of macula, bilateral: Secondary | ICD-10-CM | POA: Diagnosis not present

## 2021-02-04 DIAGNOSIS — H35363 Drusen (degenerative) of macula, bilateral: Secondary | ICD-10-CM | POA: Diagnosis not present

## 2021-02-04 DIAGNOSIS — H35033 Hypertensive retinopathy, bilateral: Secondary | ICD-10-CM | POA: Diagnosis not present

## 2021-02-04 DIAGNOSIS — H04123 Dry eye syndrome of bilateral lacrimal glands: Secondary | ICD-10-CM | POA: Diagnosis not present

## 2021-02-04 DIAGNOSIS — H524 Presbyopia: Secondary | ICD-10-CM | POA: Diagnosis not present

## 2021-02-06 DIAGNOSIS — L819 Disorder of pigmentation, unspecified: Secondary | ICD-10-CM | POA: Diagnosis not present

## 2021-02-12 DIAGNOSIS — M1A9XX1 Chronic gout, unspecified, with tophus (tophi): Secondary | ICD-10-CM | POA: Diagnosis not present

## 2021-04-16 DIAGNOSIS — M159 Polyosteoarthritis, unspecified: Secondary | ICD-10-CM | POA: Diagnosis not present

## 2021-04-16 DIAGNOSIS — M2550 Pain in unspecified joint: Secondary | ICD-10-CM | POA: Diagnosis not present

## 2021-04-16 DIAGNOSIS — M1A9XX1 Chronic gout, unspecified, with tophus (tophi): Secondary | ICD-10-CM | POA: Diagnosis not present

## 2021-05-08 DIAGNOSIS — L814 Other melanin hyperpigmentation: Secondary | ICD-10-CM | POA: Diagnosis not present

## 2021-05-08 DIAGNOSIS — S0080XA Unspecified superficial injury of other part of head, initial encounter: Secondary | ICD-10-CM | POA: Diagnosis not present

## 2021-05-08 DIAGNOSIS — D492 Neoplasm of unspecified behavior of bone, soft tissue, and skin: Secondary | ICD-10-CM | POA: Diagnosis not present

## 2021-05-08 DIAGNOSIS — L819 Disorder of pigmentation, unspecified: Secondary | ICD-10-CM | POA: Diagnosis not present

## 2021-06-21 ENCOUNTER — Emergency Department (HOSPITAL_COMMUNITY): Payer: Medicare Other

## 2021-06-21 ENCOUNTER — Inpatient Hospital Stay (HOSPITAL_COMMUNITY)
Admission: EM | Admit: 2021-06-21 | Discharge: 2021-06-29 | DRG: 215 | Disposition: E | Payer: Medicare Other | Attending: Cardiology | Admitting: Cardiology

## 2021-06-21 ENCOUNTER — Encounter (HOSPITAL_COMMUNITY): Admission: EM | Disposition: E | Payer: Self-pay | Source: Home / Self Care | Attending: Cardiology

## 2021-06-21 DIAGNOSIS — I462 Cardiac arrest due to underlying cardiac condition: Secondary | ICD-10-CM | POA: Diagnosis present

## 2021-06-21 DIAGNOSIS — R404 Transient alteration of awareness: Secondary | ICD-10-CM | POA: Diagnosis not present

## 2021-06-21 DIAGNOSIS — I2102 ST elevation (STEMI) myocardial infarction involving left anterior descending coronary artery: Secondary | ICD-10-CM

## 2021-06-21 DIAGNOSIS — I2510 Atherosclerotic heart disease of native coronary artery without angina pectoris: Secondary | ICD-10-CM | POA: Diagnosis present

## 2021-06-21 DIAGNOSIS — E669 Obesity, unspecified: Secondary | ICD-10-CM | POA: Diagnosis present

## 2021-06-21 DIAGNOSIS — I4901 Ventricular fibrillation: Secondary | ICD-10-CM

## 2021-06-21 DIAGNOSIS — Z6829 Body mass index (BMI) 29.0-29.9, adult: Secondary | ICD-10-CM

## 2021-06-21 DIAGNOSIS — M169 Osteoarthritis of hip, unspecified: Secondary | ICD-10-CM | POA: Diagnosis present

## 2021-06-21 DIAGNOSIS — M1990 Unspecified osteoarthritis, unspecified site: Secondary | ICD-10-CM | POA: Diagnosis present

## 2021-06-21 DIAGNOSIS — I469 Cardiac arrest, cause unspecified: Secondary | ICD-10-CM | POA: Diagnosis not present

## 2021-06-21 DIAGNOSIS — I213 ST elevation (STEMI) myocardial infarction of unspecified site: Secondary | ICD-10-CM | POA: Diagnosis not present

## 2021-06-21 DIAGNOSIS — I2584 Coronary atherosclerosis due to calcified coronary lesion: Secondary | ICD-10-CM | POA: Diagnosis not present

## 2021-06-21 DIAGNOSIS — I2109 ST elevation (STEMI) myocardial infarction involving other coronary artery of anterior wall: Principal | ICD-10-CM | POA: Diagnosis present

## 2021-06-21 DIAGNOSIS — I4892 Unspecified atrial flutter: Secondary | ICD-10-CM | POA: Diagnosis present

## 2021-06-21 DIAGNOSIS — Z87891 Personal history of nicotine dependence: Secondary | ICD-10-CM | POA: Diagnosis not present

## 2021-06-21 DIAGNOSIS — I4430 Unspecified atrioventricular block: Secondary | ICD-10-CM | POA: Diagnosis present

## 2021-06-21 DIAGNOSIS — Z96641 Presence of right artificial hip joint: Secondary | ICD-10-CM | POA: Diagnosis not present

## 2021-06-21 DIAGNOSIS — I4510 Unspecified right bundle-branch block: Secondary | ICD-10-CM | POA: Diagnosis present

## 2021-06-21 DIAGNOSIS — R079 Chest pain, unspecified: Secondary | ICD-10-CM | POA: Diagnosis not present

## 2021-06-21 DIAGNOSIS — R402432 Glasgow coma scale score 3-8, at arrival to emergency department: Secondary | ICD-10-CM | POA: Diagnosis present

## 2021-06-21 DIAGNOSIS — I499 Cardiac arrhythmia, unspecified: Secondary | ICD-10-CM | POA: Diagnosis not present

## 2021-06-21 DIAGNOSIS — Z20822 Contact with and (suspected) exposure to covid-19: Secondary | ICD-10-CM | POA: Diagnosis present

## 2021-06-21 DIAGNOSIS — Z452 Encounter for adjustment and management of vascular access device: Secondary | ICD-10-CM

## 2021-06-21 DIAGNOSIS — Z743 Need for continuous supervision: Secondary | ICD-10-CM | POA: Diagnosis not present

## 2021-06-21 DIAGNOSIS — J9600 Acute respiratory failure, unspecified whether with hypoxia or hypercapnia: Secondary | ICD-10-CM | POA: Diagnosis not present

## 2021-06-21 DIAGNOSIS — R570 Cardiogenic shock: Secondary | ICD-10-CM | POA: Diagnosis present

## 2021-06-21 HISTORY — PX: LEFT HEART CATH AND CORONARY ANGIOGRAPHY: CATH118249

## 2021-06-21 HISTORY — PX: CORONARY STENT INTERVENTION W/IMPELLA: CATH118235

## 2021-06-21 HISTORY — PX: CORONARY/GRAFT ACUTE MI REVASCULARIZATION: CATH118305

## 2021-06-21 LAB — CBC WITH DIFFERENTIAL/PLATELET
Abs Immature Granulocytes: 0.57 10*3/uL — ABNORMAL HIGH (ref 0.00–0.07)
Basophils Absolute: 0.2 10*3/uL — ABNORMAL HIGH (ref 0.0–0.1)
Basophils Relative: 1 %
Eosinophils Absolute: 0.1 10*3/uL (ref 0.0–0.5)
Eosinophils Relative: 1 %
HCT: 51.9 % (ref 39.0–52.0)
Hemoglobin: 17 g/dL (ref 13.0–17.0)
Immature Granulocytes: 5 %
Lymphocytes Relative: 31 %
Lymphs Abs: 3.8 10*3/uL (ref 0.7–4.0)
MCH: 33.1 pg (ref 26.0–34.0)
MCHC: 32.8 g/dL (ref 30.0–36.0)
MCV: 101.2 fL — ABNORMAL HIGH (ref 80.0–100.0)
Monocytes Absolute: 0.6 10*3/uL (ref 0.1–1.0)
Monocytes Relative: 5 %
Neutro Abs: 6.9 10*3/uL (ref 1.7–7.7)
Neutrophils Relative %: 57 %
Platelets: 142 10*3/uL — ABNORMAL LOW (ref 150–400)
RBC: 5.13 MIL/uL (ref 4.22–5.81)
RDW: 14.1 % (ref 11.5–15.5)
WBC: 12.1 10*3/uL — ABNORMAL HIGH (ref 4.0–10.5)
nRBC: 0.2 % (ref 0.0–0.2)

## 2021-06-21 LAB — POCT I-STAT 7, (LYTES, BLD GAS, ICA,H+H)
Acid-base deficit: 9 mmol/L — ABNORMAL HIGH (ref 0.0–2.0)
Bicarbonate: 19.5 mmol/L — ABNORMAL LOW (ref 20.0–28.0)
Calcium, Ion: 1.07 mmol/L — ABNORMAL LOW (ref 1.15–1.40)
HCT: 45 % (ref 39.0–52.0)
Hemoglobin: 15.3 g/dL (ref 13.0–17.0)
O2 Saturation: 80 %
Potassium: 3.2 mmol/L — ABNORMAL LOW (ref 3.5–5.1)
Sodium: 143 mmol/L (ref 135–145)
TCO2: 21 mmol/L — ABNORMAL LOW (ref 22–32)
pCO2 arterial: 53.2 mmHg — ABNORMAL HIGH (ref 32.0–48.0)
pH, Arterial: 7.173 — CL (ref 7.350–7.450)
pO2, Arterial: 56 mmHg — ABNORMAL LOW (ref 83.0–108.0)

## 2021-06-21 LAB — LIPID PANEL
Cholesterol: 174 mg/dL (ref 0–200)
HDL: 39 mg/dL — ABNORMAL LOW (ref >40)
LDL Cholesterol: 117 mg/dL — ABNORMAL HIGH (ref 0–99)
Total CHOL/HDL Ratio: 4.5 RATIO
Triglycerides: 90 mg/dL (ref <150)
VLDL: 18 mg/dL (ref 0–40)

## 2021-06-21 LAB — PROTIME-INR
INR: 1.2 (ref 0.8–1.2)
Prothrombin Time: 15.2 seconds (ref 11.4–15.2)

## 2021-06-21 LAB — COMPREHENSIVE METABOLIC PANEL
ALT: 40 U/L (ref 0–44)
AST: 55 U/L — ABNORMAL HIGH (ref 15–41)
Albumin: 3.4 g/dL — ABNORMAL LOW (ref 3.5–5.0)
Alkaline Phosphatase: 105 U/L (ref 38–126)
Anion gap: 11 (ref 5–15)
BUN: 14 mg/dL (ref 8–23)
CO2: 19 mmol/L — ABNORMAL LOW (ref 22–32)
Calcium: 8.2 mg/dL — ABNORMAL LOW (ref 8.9–10.3)
Chloride: 110 mmol/L (ref 98–111)
Creatinine, Ser: 1.2 mg/dL (ref 0.61–1.24)
GFR, Estimated: >60 mL/min (ref >60)
Glucose, Bld: 257 mg/dL — ABNORMAL HIGH (ref 70–99)
Potassium: 3.2 mmol/L — ABNORMAL LOW (ref 3.5–5.1)
Sodium: 140 mmol/L (ref 135–145)
Total Bilirubin: 1 mg/dL (ref 0.3–1.2)
Total Protein: 5.6 g/dL — ABNORMAL LOW (ref 6.5–8.1)

## 2021-06-21 LAB — POCT ACTIVATED CLOTTING TIME
Activated Clotting Time: 227 seconds
Activated Clotting Time: 383 seconds

## 2021-06-21 LAB — HEMOGLOBIN A1C
Hgb A1c MFr Bld: 6 % — ABNORMAL HIGH (ref 4.8–5.6)
Mean Plasma Glucose: 125.5 mg/dL

## 2021-06-21 LAB — POCT I-STAT, CHEM 8
BUN: 18 mg/dL (ref 8–23)
Calcium, Ion: 1.17 mmol/L (ref 1.15–1.40)
Chloride: 86 mmol/L — ABNORMAL LOW (ref 98–111)
Creatinine, Ser: 0.6 mg/dL — ABNORMAL LOW (ref 0.61–1.24)
Glucose, Bld: 250 mg/dL — ABNORMAL HIGH (ref 70–99)
HCT: 39 % (ref 39.0–52.0)
Hemoglobin: 13.3 g/dL (ref 13.0–17.0)
Potassium: 2.4 mmol/L — CL (ref 3.5–5.1)
Sodium: 122 mmol/L — ABNORMAL LOW (ref 135–145)
TCO2: 17 mmol/L — ABNORMAL LOW (ref 22–32)

## 2021-06-21 LAB — RESP PANEL BY RT-PCR (FLU A&B, COVID) ARPGX2
Influenza A by PCR: NEGATIVE
Influenza B by PCR: NEGATIVE
SARS Coronavirus 2 by RT PCR: NEGATIVE

## 2021-06-21 LAB — APTT: aPTT: 37 seconds — ABNORMAL HIGH (ref 24–36)

## 2021-06-21 LAB — TROPONIN I (HIGH SENSITIVITY): Troponin I (High Sensitivity): 345 ng/L (ref <18)

## 2021-06-21 SURGERY — CORONARY/GRAFT ACUTE MI REVASCULARIZATION
Anesthesia: LOCAL

## 2021-06-21 MED ORDER — NITROGLYCERIN 1 MG/10 ML FOR IR/CATH LAB
INTRA_ARTERIAL | Status: AC
  Filled 2021-06-21: qty 10

## 2021-06-21 MED ORDER — IOHEXOL 350 MG/ML SOLN
INTRAVENOUS | Status: DC | PRN
  Administered 2021-06-21: 18:00:00 120 mL via INTRA_ARTERIAL

## 2021-06-21 MED ORDER — EPINEPHRINE 1 MG/10ML IJ SOSY
PREFILLED_SYRINGE | INTRAMUSCULAR | Status: AC | PRN
  Administered 2021-06-21: 1 mg via INTRAVENOUS

## 2021-06-21 MED ORDER — HEPARIN SODIUM (PORCINE) 1000 UNIT/ML IJ SOLN
INTRAMUSCULAR | Status: DC | PRN
  Administered 2021-06-21: 2000 [IU] via INTRAVENOUS
  Administered 2021-06-21: 7000 [IU] via INTRAVENOUS

## 2021-06-21 MED ORDER — HEPARIN (PORCINE) IN NACL 1000-0.9 UT/500ML-% IV SOLN
INTRAVENOUS | Status: AC
  Filled 2021-06-21: qty 1000

## 2021-06-21 MED ORDER — SODIUM CHLORIDE 0.9 % IV SOLN
INTRAVENOUS | Status: DC | PRN
  Administered 2021-06-21: 1000 mL via INTRAVENOUS

## 2021-06-21 MED ORDER — ETOMIDATE 2 MG/ML IV SOLN
INTRAVENOUS | Status: AC | PRN
  Administered 2021-06-21: 10 mg via INTRAVENOUS

## 2021-06-21 MED ORDER — EPINEPHRINE 1 MG/10ML IJ SOSY
PREFILLED_SYRINGE | INTRAMUSCULAR | Status: AC
  Filled 2021-06-21: qty 10

## 2021-06-21 MED ORDER — CALCIUM CHLORIDE 10 % IV SOLN
INTRAVENOUS | Status: AC
  Filled 2021-06-21: qty 10

## 2021-06-21 MED ORDER — LIDOCAINE HCL (PF) 1 % IJ SOLN
INTRAMUSCULAR | Status: AC
  Filled 2021-06-21: qty 30

## 2021-06-21 MED ORDER — SODIUM BICARBONATE 8.4 % IV SOLN
INTRAVENOUS | Status: AC
  Filled 2021-06-21: qty 50

## 2021-06-21 MED ORDER — LIDOCAINE HCL (PF) 1 % IJ SOLN
INTRAMUSCULAR | Status: AC | PRN
  Administered 2021-06-21: 15 mg via INTRADERMAL

## 2021-06-21 MED ORDER — HEPARIN SODIUM (PORCINE) 1000 UNIT/ML IJ SOLN
INTRAMUSCULAR | Status: AC
  Filled 2021-06-21: qty 10

## 2021-06-21 MED ORDER — CALCIUM CHLORIDE 10 % IV SOLN
INTRAVENOUS | Status: DC | PRN
  Administered 2021-06-21: 1 g via INTRAVENOUS

## 2021-06-21 MED ORDER — POTASSIUM CHLORIDE 10 MEQ/100ML IV SOLN
10.0000 meq | INTRAVENOUS | Status: DC
  Filled 2021-06-21: qty 100

## 2021-06-21 MED ORDER — SODIUM CHLORIDE 0.9 % IV SOLN: INTRAVENOUS | Status: DC

## 2021-06-21 MED ORDER — ASPIRIN 300 MG RE SUPP
300.0000 mg | Freq: Once | RECTAL | Status: AC
  Administered 2021-06-21: 16:00:00 300 mg via RECTAL

## 2021-06-21 MED ORDER — VERAPAMIL HCL 2.5 MG/ML IV SOLN
INTRAVENOUS | Status: AC
  Filled 2021-06-21: qty 2

## 2021-06-21 MED ORDER — HEPARIN SODIUM (PORCINE) 5000 UNIT/ML IJ SOLN
60.0000 [IU]/kg | Freq: Once | INTRAMUSCULAR | Status: AC
  Administered 2021-06-21: 16:00:00 4000 [IU] via INTRAVENOUS

## 2021-06-21 MED ORDER — NOREPINEPHRINE 4 MG/250ML-% IV SOLN
0.0000 ug/min | INTRAVENOUS | Status: DC
  Administered 2021-06-21: 16:00:00 10 ug/min via INTRAVENOUS

## 2021-06-21 MED ORDER — SODIUM BICARBONATE 8.4 % IV SOLN
INTRAVENOUS | Status: DC | PRN
  Administered 2021-06-21 (×2): 100 meq via INTRAVENOUS

## 2021-06-21 MED ORDER — EPINEPHRINE HCL 5 MG/250ML IV SOLN IN NS
0.5000 ug/min | INTRAVENOUS | Status: DC
  Administered 2021-06-21: 10 ug/min via INTRAVENOUS
  Filled 2021-06-21: qty 250

## 2021-06-21 MED ORDER — ROCURONIUM BROMIDE 50 MG/5ML IV SOLN
INTRAVENOUS | Status: AC | PRN
  Administered 2021-06-21: 90 mg via INTRAVENOUS

## 2021-06-21 MED ORDER — DEXTROSE 5 % IV SOLN
INTRAVENOUS | Status: AC | PRN
  Administered 2021-06-21: 16:00:00 150 mg via INTRAVENOUS

## 2021-06-21 MED ORDER — POTASSIUM CHLORIDE 10 MEQ/100ML IV SOLN
INTRAVENOUS | Status: AC
  Filled 2021-06-21: qty 100

## 2021-06-21 MED ORDER — SODIUM BICARBONATE 8.4 % IV SOLN
INTRAVENOUS | Status: AC | PRN
  Administered 2021-06-21: 50 meq via INTRAVENOUS

## 2021-06-21 MED ORDER — EPINEPHRINE 1 MG/10ML IJ SOSY
PREFILLED_SYRINGE | INTRAMUSCULAR | Status: DC | PRN
  Administered 2021-06-21 (×5): 1 mg via INTRAVENOUS

## 2021-06-21 SURGICAL SUPPLY — 25 items
BALLN SAPPHIRE 1.5X12 (BALLOONS) ×2
BALLN SAPPHIRE 2.5X15 (BALLOONS) ×2
BALLN SAPPHIRE 2.5X20 (BALLOONS) ×2
BALLN SAPPHIRE 3.0X20 (BALLOONS) ×2
BALLOON SAPPHIRE 1.5X12 (BALLOONS) IMPLANT
BALLOON SAPPHIRE 2.5X15 (BALLOONS) IMPLANT
BALLOON SAPPHIRE 2.5X20 (BALLOONS) IMPLANT
BALLOON SAPPHIRE 3.0X20 (BALLOONS) IMPLANT
CATH INFINITI 5FR MULTPACK ANG (CATHETERS) ×1 IMPLANT
CATH INFINITI JR4 5F (CATHETERS) ×1 IMPLANT
CATH VISTA GUIDE 6FR XBLAD3.5 (CATHETERS) ×1 IMPLANT
GLIDESHEATH SLEND SS 6F .021 (SHEATH) ×1 IMPLANT
GUIDEWIRE INQWIRE 1.5J.035X260 (WIRE) IMPLANT
GUIDEWIRE SAFE TJ AMPLATZ EXST (WIRE) ×1 IMPLANT
INQWIRE 1.5J .035X260CM (WIRE) ×2
KIT ENCORE 26 ADVANTAGE (KITS) ×1 IMPLANT
KIT ESSENTIALS PG (KITS) ×1 IMPLANT
KIT HEART LEFT (KITS) ×2 IMPLANT
PACK CARDIAC CATHETERIZATION (CUSTOM PROCEDURE TRAY) ×2 IMPLANT
SET IMPELLA CP PUMP (CATHETERS) ×1 IMPLANT
SYR MEDRAD MARK 7 150ML (SYRINGE) ×2 IMPLANT
TRANSDUCER W/STOPCOCK (MISCELLANEOUS) ×2 IMPLANT
TUBING CIL FLEX 10 FLL-RA (TUBING) ×2 IMPLANT
WIRE ASAHI PROWATER 180CM (WIRE) ×1 IMPLANT
WIRE EMERALD 3MM-J .035X150CM (WIRE) ×2 IMPLANT

## 2021-06-23 DIAGNOSIS — R570 Cardiogenic shock: Secondary | ICD-10-CM | POA: Diagnosis present

## 2021-06-24 ENCOUNTER — Encounter (HOSPITAL_COMMUNITY): Payer: Self-pay | Admitting: Cardiology

## 2021-06-24 MED FILL — Potassium Chloride Inj 10 mEq/100ML: INTRAVENOUS | Qty: 100 | Status: AC

## 2021-06-24 MED FILL — Heparin Sod (Porcine)-NaCl IV Soln 1000 Unit/500ML-0.9%: INTRAVENOUS | Qty: 1000 | Status: AC

## 2021-06-26 MED FILL — Ondansetron HCl Inj 4 MG/2ML (2 MG/ML): INTRAMUSCULAR | Qty: 2 | Status: AC

## 2021-06-26 MED FILL — Verapamil HCl IV Soln 2.5 MG/ML: INTRAVENOUS | Qty: 2 | Status: AC

## 2021-06-29 NOTE — Progress Notes (Signed)
Patient transported to [facility] lab on vent. RN at bedside.

## 2021-06-29 NOTE — Progress Notes (Signed)
[place] [facility] Shock Patient Intake Sheet  Complete this form for all MI patients presenting with Cardiogenic Shock. This form must be completed by a Cath Lab Super-Tech or Interventionalist. Once completed please EPIC message Valerie Salts   Inclusion criteria:  Choose all that apply:  [x]  Symptoms of acute myocardial infarction with ECG and/biomarker evidence of S-T elevation myocardial infarction or non-S-T myocardial infarction.  [x]  Systolic blood pressure < 50mmHg at baseline OR use of inotopes or vasopressors to maintain SBP >33mmHg + LVEDP >=46mmHg  [x]  Evidence of end organ hypoperfusion  [x]  Patient undergoes PCI  Exclusion criteria:  Was there a reason to exclude patient from the Clinical Shock Protocol?     (if YES, check reason and rest of form will not need to be filled out)           []  Evidence of anoxic brain injury           []  Unwitnessed out of hospital cardiac arrest or any       cardiac arrest in which return of spontaneous circulation (ROSC) is not achieved in 30 minutes.           []  IABP placed prior to Impella           []  Patient already supported with an Impella           []  Septic, anaphylactic and hemorrhage causes of shock           []  Neurologic and Non-ischemic cause of shock/hypotension (pulmonary embolism, pneumothorax, myocarditis, tamponade, etc.)           []  Active bleeding for which mechanical circulatory   support is contraindicated           []  Recent major surgery for which mechanical circulatory support is contraindicated            []  Mechanical complications of AMI (acute ventricular septal defect (VSD) or acute papillary muscle rupture)           []  Known left ventricular thrombus for which mechanical circulatory support is contraindicated           []  Mechanical aortic prosthetic valve           []  Contraindication to intravenous systemic anticoagulation  []  Yes            [x]  No  3.  Was LVEDP obtained before  PCI and Impella?  [x]  Yes            []  No  If obtained pre PCI/Impella was it over 15 mm? []  Yes            [x]  No  4.  Was Severe Shock identified prior PCI? If YES, select which item was present to identify severe shock)  [x]  SBP <65mm  [x]  High dose pressors  []  Shock with SBP 80-90 or low dose pressors only, but with with EF <30% in anterior STEMI or Prox LAD or Left Main  []  Shock with SBP 80-90 or only on low pressors but with EF <20% in interior or lateral MI or RCA or Circ as culpit lesion.  [x]  Impella placed   [x]  pre PCI                               []  post PCI  []  Was Right heart cath performed prior to leaving cath lab?                  []   YES        [x]  NO  [x]  Yes            []  No  5.  Not Severe Shock identified prior to PCI?  If NO, then patient is presumed to have had a severe shock and rest of questions do not need to be answered)  If YES was impella placed? []  Yes            []  No  If Impella was placed post PCI, was the patient still in Shock before Impella was placed? []  Yes            []  No  Was Right heart cath performed before Impella placement? []  Yes            []  No  If YES, what were the following values pre-Impella placement? Cardiac Index: Click or tap here to enter text. Cardiac Power: Click or tap here to enter text.  If Impella placed, state why.  []  Patient deteriorated into severe shock post PCI as defined by criteria of the [place] [facility].  []  CI or CPO Low post PCI by RCA  []  Other, (please briefly explain to the right ? If other, briefly explain here:

## 2021-06-29 NOTE — H&P (Signed)
Cardiology Admission History and Physical:   Patient ID: Damon Morgan MRN: [id number]; DOB: August 03, 1935   Admission date: 06/28/21  PCP:  Lavone Orn, MD   [facility] Providers Cardiologist:  None        Chief Complaint:  CPR  Patient Profile:   Damon Morgan is a 86 y.o. male with no past cardiac history  who is being seen Jun 28, 2021 for the evaluation of post CPR.  History of Present Illness:   Damon Morgan has a history of arthritis with prior hip replacement who arrived by EMS post CPR. History limited. EMS performed CPR for 10 minutes with ROSC. He was intubated. On arrival to ED he had a pulse and rhythm but then developed recurrent PEA arrest. Received 2 rounds of CPR and pressors with return of circulation. Ecg by EMS showed a new RBBB with anterior ST elevation. Patient was apparently healthy prior to this and going to the gym regularly. Once we were able to restore spontaneous circulation decision was made to take the patient to the cardiac cath lab for emergent cardiac cath, PCI and hemodynamic support.    Past Medical History:  Diagnosis Date   Arthritis    No pertinent past medical history     Past Surgical History:  Procedure Laterality Date   ARTHROPLASTY  01/12/2012   rt hip   COLONOSCOPY     INGUINAL HERNIA REPAIR  1999   lt   TONSILLECTOMY     TOTAL HIP ARTHROPLASTY  01/12/2012   Procedure: TOTAL HIP ARTHROPLASTY ANTERIOR APPROACH;  Surgeon: Hessie Dibble, MD;  Location: [place];  Service: Orthopedics;  Laterality: Right;     Medications Prior to Admission: Prior to Admission medications   Not on File     Allergies:   No Known Allergies  Social History:   Social History   Socioeconomic History   Marital status: Married    Spouse name: Not on file   Number of children: Not on file   Years of education: Not on file   Highest education level: Not on file  Occupational History   Not on file  Tobacco Use   Smoking status: Former     Types: Cigarettes    Quit date: 12/24/1974    Years since quitting: 46.5   Smokeless tobacco: Never  Substance and Sexual Activity   Alcohol use: Yes    Comment: occ   Drug use: No   Sexual activity: Not on file  Other Topics Concern   Not on file  Social History Narrative   Not on file   Social Determinants of Health   Financial Resource Strain: Not on file  Food Insecurity: Not on file  Transportation Needs: Not on file  Physical Activity: Not on file  Stress: Not on file  Social Connections: Not on file  Intimate Partner Violence: Not on file    Family History:  Unknown. History is unobtainable.  The patient's family history is not on file.    ROS:  Please see the history of present illness.  Unable to obtain ROS since intubated.   Physical Exam/Data:   Vitals:   06/28/21 1559 06/28/21 1600 Jun 28, 2021 1605 June 28, 2021 1633  BP:  (!) 149/92    Pulse:      Resp: 14 14    SpO2:   96% (!) 82%  Height:    6\' 1"  (1.854 m)   No intake or output data in the 24 hours ending 06-28-21 1832 Last 3 Weights  01/06/2012 12/24/2011  Weight (lbs) 225 lb 4.8 oz 223 lb  Weight (kg) 102.195 kg 101.152 kg     Body mass index is 29.72 kg/m.  General: Obese elderly WM intubated.  HEENT: normal Neck: + JVD Vascular: No carotid bruits; Distal pulses 2+ bilaterally   Cardiac:  normal S1, S2; RRR; no murmur  Lungs:  clear to auscultation bilaterally, no wheezing, rhonchi or rales  Abd: soft, nontender, no hepatomegaly  Ext: no edema. Pulses palpable femorally. Distal pulses poor.  Skin: warm and dry  Neuro:  unresponsive on vent.   EKG:  The ECG that was done today was personally reviewed and demonstrates NSR with RBBB and ST elevation in the anterior leads. New since 2013.  Relevant CV Studies: none  Laboratory Data:  High Sensitivity Troponin:   Recent Labs  Lab 2021-06-25 1602  TROPONINIHS 345*      Chemistry Recent Labs  Lab Jun 25, 2021 1602 06/25/21 1652 Jun 25, 2021 1726   NA 140 143 122*  K 3.2* 3.2* 2.4*  CL 110  --  86*  CO2 19*  --   --   GLUCOSE 257*  --  250*  BUN 14  --  18  CREATININE 1.20  --  0.60*  CALCIUM 8.2*  --   --   GFRNONAA >60  --   --   ANIONGAP 11  --   --     Recent Labs  Lab 25-Jun-2021 1602  PROT 5.6*  ALBUMIN 3.4*  AST 55*  ALT 40  ALKPHOS 105  BILITOT 1.0   Lipids  Recent Labs  Lab Jun 25, 2021 1602  CHOL 174  TRIG 90  HDL 39*  LDLCALC 117*  CHOLHDL 4.5   Hematology Recent Labs  Lab 2021-06-25 1602 06/25/2021 1652 June 25, 2021 1726  WBC 12.1*  --   --   RBC 5.13  --   --   HGB 17.0 15.3 13.3  HCT 51.9 45.0 39.0  MCV 101.2*  --   --   MCH 33.1  --   --   MCHC 32.8  --   --   RDW 14.1  --   --   PLT 142*  --   --    Thyroid No results for input(s): TSH, FREET4 in the last 168 hours. BNPNo results for input(s): BNP, PROBNP in the last 168 hours.  DDimer No results for input(s): DDIMER in the last 168 hours.   Radiology/Studies:  CARDIAC CATHETERIZATION  Result Date: 06/25/21   Ost RCA to Prox RCA lesion is 10% stenosed.   Mid RCA to Dist RCA lesion is 30% stenosed.   Ost LAD to Mid LAD lesion is 100% stenosed.   Ost Cx to Prox Cx lesion is 95% stenosed.   Balloon angioplasty was performed using a BALLN SAPPHIRE 3.0X20.   Post intervention, there is a 100% residual stenosis.   LV end diastolic pressure is normal. Critical 2 vessel obstructive CAD including 100% proximal LAD and 95% ostial LCx Cardiogenic shock- refractory to multiple pressors and Impella support. Unsuccessful PCI of the LAD with POBA - unable to restore flow despite good balloon inflation. Patient expired.   DG Chest Portable 1 View  Result Date: 06-25-2021 CLINICAL DATA:  Myocardial infarction, post CPR EXAM: PORTABLE CHEST 1 VIEW COMPARISON:  01/06/2012 FINDINGS: Transverse diameter of heart is increased. There is linear faint lucency along the left cardiac margin. Diffuse alveolar densities seen in the parahilar regions and lower lung fields.  Left lateral CP angle is not included  in the radiograph. There is no definite demonstrable pneumothorax. Tip of endotracheal tube is approximately 4.8 cm above the carina. Enteric tube is noted traversing the esophagus. Degenerative changes are noted in both shoulders. IMPRESSION: Diffuse alveolar densities seen in both lungs suggesting pulmonary edema. Possibility of underlying pneumonia is not excluded. There is linear radiolucency along the left margin of mediastinum. This may suggest small pneumomediastinum. There is no demonstrable pneumothorax. Other findings as described in the body of the report. Electronically Signed   By: Elmer Picker M.D.   On: Jul 20, 2021 16:23     Assessment and Plan:   Acute anterior STEMI with cardiogenic shock post CPR. Will transport directly to the cardiac cath lab for cardiac cath/PCI/hemodynamic support.    Risk Assessment/Risk Scores:    TIMI Risk Score for ST  Elevation MI:   The patient's TIMI risk score is 9, which indicates a 35.9% risk of all cause mortality at 30 days.   New York Heart Association (NYHA) Functional Class NYHA Class IV     Severity of Illness: The appropriate patient status for this patient is INPATIENT. Inpatient status is judged to be reasonable and necessary in order to provide the required intensity of service to ensure the patient's safety. The patient's presenting symptoms, physical exam findings, and initial radiographic and laboratory data in the context of their chronic comorbidities is felt to place them at high risk for further clinical deterioration. Furthermore, it is not anticipated that the patient will be medically stable for discharge from the hospital within 2 midnights of admission.   * I certify that at the point of admission it is my clinical judgment that the patient will require inpatient hospital care spanning beyond 2 midnights from the point of admission due to high intensity of service, high risk for  further deterioration and high frequency of surveillance required.*   For questions or updates, please contact [place] Please consult [email or web address] for contact info under     Signed, Emeree Mahler Martinique, MD  07/20/21 6:32 PM

## 2021-06-29 NOTE — ED Triage Notes (Signed)
Pt here via [facility] as cpr in progress and code STEMI. Pt had witnessed arrest by wife, pt fell to ground. When ems arrived, pt was pulseless and apneic. CPR started, initial arrest rhythm was v-fib, pt received 2 shocks total. ROSC achieved at 1528, pt lost pulses aggain at 1539, rhythm PEA w/ runs of vtach. EMS gave 300mg  amiodarone, total 4 epi's, and 1.5 L NS. Pt has IO L tib.

## 2021-06-29 NOTE — Progress Notes (Signed)
° °  ADVANCED HF/SHOCK TEAM NOTE  Mr. Damon Morgan is an 86 y/o male with no significant PMHx.  Developed CP and burping earlier today. EMS called. Developed cardiac arrest with CPR and intubation en route. ROSC obtained in ED. ECG c/w anterior STEMI.   Brought to cath lab emergently by Dr. Martinique.   In cath lab patient with recurrent cardiac arrest requiring shocks and CPR.   I was called to assist with management of shock.  Impella CP placed urgently by Dr. Martinique.   NE titrated. Patient had recurrent VF arrest. Epi started. Also received several amps of bicarb.   Coronary angio with proximal occlusion of LAD and 95% ostial LCx.   LAD heavily calcified and unable to restore flow.   Patient with recurrent cardiac arrest with prolonged CPR and multiple defibirllations. NE and Epi titrated without benefit.   Situation felt to be unsurvivable and rescuscitative measures stopped.  I informed wife and 2 daughters in waiting room and brought them back to cath lab to see him.   Total CCT 90 mins  Damon Bickers, MD  6:48 PM

## 2021-06-29 NOTE — ED Provider Notes (Signed)
[facility] EMERGENCY DEPARTMENT Provider Note   CSN: [id number] Arrival date & time: 07-06-21  1550     History Chief Complaint  Patient presents with   Cardiac Arrest   Code STEMI    Damon Morgan is a 86 y.o. male.  HPI     86 year old male with history of arthritis brought in by EMS with chief complaint of cardiac arrest.  Level 5 caveat for unresponsive patient.  According to EMS, patient was with his wife when he collapsed in the room.  CPR was started by fire.  Briefly, patient had total of 4 rounds of epi given and required 2 rounds of defibrillation.  There was an episode of ROSC that lasted for 10 minutes and patient went back on CPR.  He arrives to the ER with CPR.  Blood glucose is normal.   Past Medical History:  Diagnosis Date   Arthritis    No pertinent past medical history     Patient Active Problem List   Diagnosis Date Noted   DJD (degenerative joint disease) of hip 01/12/2012    Class: Chronic    Past Surgical History:  Procedure Laterality Date   ARTHROPLASTY  01/12/2012   rt hip   COLONOSCOPY     INGUINAL HERNIA REPAIR  1999   lt   TONSILLECTOMY     TOTAL HIP ARTHROPLASTY  01/12/2012   Procedure: TOTAL HIP ARTHROPLASTY ANTERIOR APPROACH;  Surgeon: Hessie Dibble, MD;  Location: [place];  Service: Orthopedics;  Laterality: Right;       No family history on file.  Social History   Tobacco Use   Smoking status: Former    Types: Cigarettes    Quit date: 12/24/1974    Years since quitting: 46.5   Smokeless tobacco: Never  Substance Use Topics   Alcohol use: Yes    Comment: occ   Drug use: No    Home Medications Prior to Admission medications   Not on File    Allergies    Patient has no known allergies.  Review of Systems   Review of Systems  Unable to perform ROS: Patient unresponsive   Physical Exam Updated Vital Signs BP (!) 149/92    Pulse (!) 26    Resp 14    SpO2 (!) 82%   Physical Exam Vitals  and nursing note reviewed.  Constitutional:      Appearance: He is well-developed.  HENT:     Head: Atraumatic.  Pulmonary:     Comments: King airway in place, blood-tinged frothy sputum noted on the airway, Front incisors are loose/avulsed Abdominal:     Palpations: Abdomen is soft.  Skin:    General: Skin is warm.  Neurological:     Comments: GCS 3    ED Results / Procedures / Treatments   Labs (all labs ordered are listed, but only abnormal results are displayed) Labs Reviewed  HEMOGLOBIN A1C - Abnormal; Notable for the following components:      Result Value   Hgb A1c MFr Bld 6.0 (*)    All other components within normal limits  CBC WITH DIFFERENTIAL/PLATELET - Abnormal; Notable for the following components:   WBC 12.1 (*)    MCV 101.2 (*)    Platelets 142 (*)    Basophils Absolute 0.2 (*)    Abs Immature Granulocytes 0.57 (*)    All other components within normal limits  APTT - Abnormal; Notable for the following components:   aPTT 37 (*)  All other components within normal limits  RESP PANEL BY RT-PCR (FLU A&B, COVID) ARPGX2  PROTIME-INR  COMPREHENSIVE METABOLIC PANEL  LIPID PANEL  TROPONIN I (HIGH SENSITIVITY)    EKG EKG Interpretation  Date/Time:  July 11, 2021 15:58:50 EST Ventricular Rate:  122 PR Interval:    QRS Duration: 152 QT Interval:  367 QTC Calculation: 523 R Axis:   -55 Text Interpretation: Atrial flutter with predominant 2:1 AV block Right bundle branch block Anterior infarct, acute (LAD) ST elevation in anterior leads - MI Confirmed by Varney Biles [phone]) on 07-11-21 4:45:00 PM  Radiology DG Chest Portable 1 View  Result Date: 07-11-2021 CLINICAL DATA:  Myocardial infarction, post CPR EXAM: PORTABLE CHEST 1 VIEW COMPARISON:  01/06/2012 FINDINGS: Transverse diameter of heart is increased. There is linear faint lucency along the left cardiac margin. Diffuse alveolar densities seen in the parahilar regions and lower lung  fields. Left lateral CP angle is not included in the radiograph. There is no definite demonstrable pneumothorax. Tip of endotracheal tube is approximately 4.8 cm above the carina. Enteric tube is noted traversing the esophagus. Degenerative changes are noted in both shoulders. IMPRESSION: Diffuse alveolar densities seen in both lungs suggesting pulmonary edema. Possibility of underlying pneumonia is not excluded. There is linear radiolucency along the left margin of mediastinum. This may suggest small pneumomediastinum. There is no demonstrable pneumothorax. Other findings as described in the body of the report. Electronically Signed   By: Elmer Picker M.D.   On: 07-11-21 16:23    Procedures .Critical Care Performed by: Varney Biles, MD Authorized by: Varney Biles, MD   Critical care provider statement:    Critical care time (minutes):  40   Critical care was time spent personally by me on the following activities:  Development of treatment plan with patient or surrogate, discussions with consultants, evaluation of patient's response to treatment, examination of patient, ordering and review of laboratory studies, ordering and review of radiographic studies, ordering and performing treatments and interventions, pulse oximetry, re-evaluation of patient's condition and review of old charts Procedure Name: Intubation Date/Time: 07-11-21 4:45 PM Performed by: Varney Biles, MD Pre-anesthesia Checklist: Patient identified Induction Type: IV induction Laryngoscope Size: Glidescope and 3 Grade View: Grade III Tube size: 7.5 mm Number of attempts: 1 Secured at: 23 cm Dental Injury: Bloody posterior oropharynx and Dental damage  Difficulty Due To: Difficulty was anticipated    CPR  Date/Time: Jul 11, 2021 4:46 PM Performed by: Varney Biles, MD Authorized by: Varney Biles, MD  CPR Procedure Details:    ACLS/BLS initiated by EMS: Yes     CPR/ACLS performed in the ED: Yes      Duration of CPR (minutes):  10   Outcome: ROSC obtained    CPR performed via ACLS guidelines under my direct supervision.  See RN documentation for details including defibrillator use, medications, doses and timing. Comments:     We had ROSC, Cath activated, patient lost pulse again with ROSC achieved after 1 round of CPR   Medications Ordered in ED Medications  0.9 %  sodium chloride infusion (has no administration in time range)  norepinephrine (LEVOPHED) 4mg  in 281mL (0.016 mg/mL) premix infusion (15 mcg/min Intravenous Rate/Dose Change Jul 11, 2021 1609)  EPINEPHrine (ADRENALIN) 1 MG/10ML injection (1 mg Intravenous Given 07/11/21 1611)  sodium bicarbonate injection (50 mEq Intravenous Given 07/11/21 1612)  EPINEPHrine (ADRENALIN) 1 MG/10ML injection (1 mg Intravenous Given 07-11-21 1553)  etomidate (AMIDATE) injection (10 mg Intravenous Given July 11, 2021 1556)  rocuronium (ZEMURON) injection (  90 mg Intravenous Given 25-Jun-2021 1557)  amiodarone (CORDARONE) 150 mg in dextrose 5 % 100 mL bolus (0 mg Intravenous Stopped 2021-06-25 1605)  heparin injection 60 Units/kg (4,000 Units Intravenous Given 2021/06/25 1600)    ED Course  I have reviewed the triage vital signs and the nursing notes.  Pertinent labs & imaging results that were available during my care of the patient were reviewed by me and considered in my medical decision making (see chart for details).    MDM Rules/Calculators/A&P                         86 year old male comes with a chief complaint of cardiac arrest.  He is a healthy male, only has history of arthritis.  Witnessed arrest by his wife.  CPR started by fire and then continued by EMS.  There was a 10-minute episode of ROSC prior to patient going back into cardiac arrest.  He had received 4 rounds of epi, 300 of amnio prior to ED arrival.  In the ED, patient received epi and we had ROSC.  Patient then went into cardiac arrest again and had 1 more round of CPR with epi and  had ROSC.  150 mg of amnio was also given.  Rectal aspirin, heparin given.  Patient likely will need pressor support.  Airway was difficult due to swelling, bleeding and dental injury.  Cath lab activated.  Family updated.     Final Clinical Impression(s) / ED Diagnoses Final diagnoses:  Cardiac arrest ([place])  ST elevation myocardial infarction (STEMI), unspecified artery [facility])    Rx / DC Orders ED Discharge Orders     None        Varney Biles, MD 06/25/2021 1649

## 2021-06-29 NOTE — Code Documentation (Signed)
4000 units heparin given IV

## 2021-06-29 DEATH — deceased

## 2021-06-30 DIAGNOSIS — I4901 Ventricular fibrillation: Secondary | ICD-10-CM

## 2021-06-30 DIAGNOSIS — I2510 Atherosclerotic heart disease of native coronary artery without angina pectoris: Secondary | ICD-10-CM

## 2021-06-30 DIAGNOSIS — J9600 Acute respiratory failure, unspecified whether with hypoxia or hypercapnia: Secondary | ICD-10-CM

## 2021-07-30 NOTE — Discharge Summary (Addendum)
Death Summary    Patient ID: Damon Morgan MRN: [id number]; DOB: 1935-11-14  Admit Date: 07-07-21 Date of Death: 2021-07-07  Primary Care Provider: Lavone Orn, MD  Primary Cardiologist: None - seen by P. Martinique, MD on day of presentation Primary Electrophysiologist:  None   Discharge Diagnoses    Principal Problem:   Cardiac arrest [facility]) Active Problems:   Ventricular fibrillation [facility])   Cardiogenic shock ([place])   Acute respiratory failure ([place])   STEMI (ST elevation myocardial infarction) ([place])   CAD (coronary artery disease)   Osteoarthritis of hip  Diagnostic Studies/Procedures    Cardiac Catheterization and Percutaneous Coronary Intervention 07-07-2021  Left Anterior Descending  Ost LAD to Mid LAD lesion is 100% stenosed. The lesion is severely calcified.  Left Circumflex  Ost Cx to Prox Cx lesion is 95% stenosed. The lesion is eccentric and ulcerative.  Right Coronary Artery  The vessel is moderately ectatic.  Ost RCA to Prox RCA lesion is 10% stenosed.  Mid RCA to Dist RCA lesion is 30% stenosed. The lesion is severely calcified.  Intervention  Ost LAD to Mid LAD lesion  Angioplasty  [place] GUIDE 6FR XBLAD3.5 guide catheter was inserted. WIRE ASAHI PROWATER 180CM guidewire used to cross lesion. Balloon angioplasty was performed using a BALLN SAPPHIRE 3.0X20. Maximum pressure: 10 atm.  Post-Intervention Lesion Assessment  The intervention was unsuccessful. Pre-interventional TIMI flow is 0. Post-intervention TIMI flow is 0.  There is a 100% residual stenosis post intervention.  _____________   History of Present Illness     Damon Morgan is a 86 y.o. male with a history of osteoarthritis status post hip replacement.  Patient suffered cardiac arrest at home on 07-07-2021 and required CPR upon EMS arrival.  CPR was performed for 10 minutes with return of spontaneous circulation and patient was intubated.  On arrival to the [facility] ED, he had a pulse  and rhythm but then developed recurrent PEA arrest and received 2 rounds of CPR and vasopressors with ROSC.  ECG showed a new right bundle branch block with anterior ST segment elevation and he was taken to the cardiac catheterization laboratory for further evaluation and hemodynamic support.  Hospital Course   Diagnostic catheterization revealed severe two-vessel disease including an occluded ostial/mid LAD and 95% stenosis in the ostial to proximal left circumflex.  PCI of the LAD was performed with progressive dilation of the proximal to mid and distal vessel.  Despite this, antegrade flow was unable to be restored.  It was noted that the LAD was severely calcified and the disease appeared to be chronic in nature.  Unfortunately, in the setting of attempted intervention, Mr. Bramel was hypotensive requiring escalation of pressor support and also became pulseless requiring CPR.  Following multiple rounds of ACLS including intravenous epinephrine, bicarbonate, and defibrillations, as well as placement of an Impella catheter, he developed recurrent PEA and expired.  Consultants: Advanced CHF/Shock (Dr. Haroldine Laws)  _____________  Labs & Radiologic Studies    CBC Lab Results  Component Value Date   WBC 12.1 (H) July 07, 2021   HGB 13.3 07/07/2021   HCT 39.0 07-07-2021   MCV 101.2 (H) 07-Jul-2021   PLT 142 (L) 93/71/6967    Basic Metabolic Panel Lab Results  Component Value Date   CREATININE 0.60 (L) 07/07/21   BUN 18 07-Jul-2021   NA 122 (L) 07/07/21   K 2.4 (LL) 07-Jul-2021   CL 86 (L) 2021-07-07   CO2 19 (L) 2021/07/07    Liver  Function Tests Lab Results  Component Value Date   ALT 40 2021-07-09   AST 55 (H) 2021-07-09   ALKPHOS 105 07-09-2021   BILITOT 1.0 Jul 09, 2021    High Sensitivity Troponin:   Recent Labs  Lab 09-Jul-2021 1602  TROPONINIHS 345*    _____________   DG Chest Portable 1 View  Result Date: 2021/07/09 CLINICAL DATA:  Myocardial infarction, post CPR EXAM:  PORTABLE CHEST 1 VIEW COMPARISON:  01/06/2012 FINDINGS: Transverse diameter of heart is increased. There is linear faint lucency along the left cardiac margin. Diffuse alveolar densities seen in the parahilar regions and lower lung fields. Left lateral CP angle is not included in the radiograph. There is no definite demonstrable pneumothorax. Tip of endotracheal tube is approximately 4.8 cm above the carina. Enteric tube is noted traversing the esophagus. Degenerative changes are noted in both shoulders. IMPRESSION: Diffuse alveolar densities seen in both lungs suggesting pulmonary edema. Possibility of underlying pneumonia is not excluded. There is linear radiolucency along the left margin of mediastinum. This may suggest small pneumomediastinum. There is no demonstrable pneumothorax. Other findings as described in the body of the report. Electronically Signed   By: Elmer Picker M.D.   On: Jul 09, 2021 16:23    Duration of Death Summary Encounter   Greater than 30 minutes including physician time.  Signed, Murray Hodgkins, NP 06/30/2021, 7:35 PM   Agree with above.  Glori Bickers, MD  4:19 PM

## 2023-08-29 IMAGING — DX DG CHEST 1V PORT
1 series · 1 of 1 positions shown · non-contrast
Comparison: 01/06/2012

CLINICAL DATA: Myocardial infarction, post CPR

EXAM:
PORTABLE CHEST 1 VIEW

[chest]
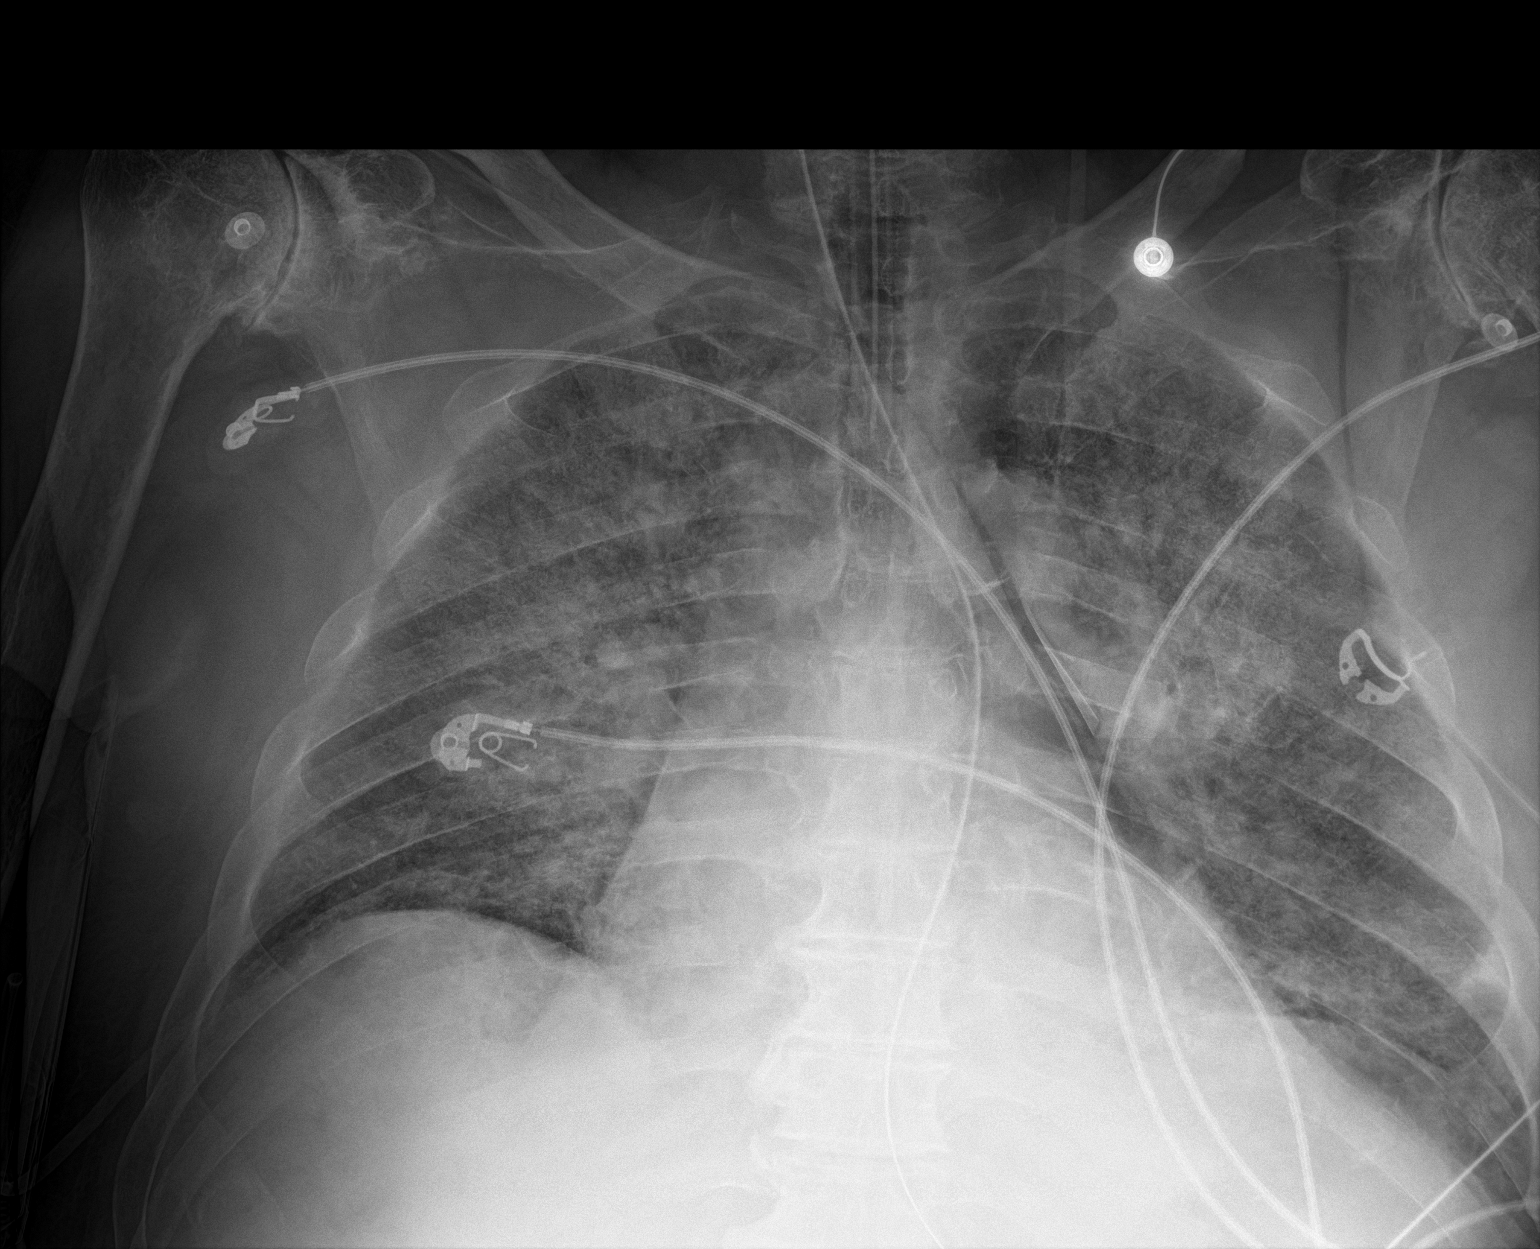

[1 of 1 positions shown; findings below may reference images not displayed]

FINDINGS: Transverse diameter of heart is increased. There is linear faint
lucency along the left cardiac margin. Diffuse alveolar densities
seen in the parahilar regions and lower lung fields. Left lateral CP
angle is not included in the radiograph. There is no definite
demonstrable pneumothorax. Tip of endotracheal tube is approximately
4.8 cm above the carina. Enteric tube is noted traversing the
esophagus. Degenerative changes are noted in both shoulders.
IMPRESSION: Diffuse alveolar densities seen in both lungs suggesting pulmonary
edema. Possibility of underlying pneumonia is not excluded. There is
linear radiolucency along the left margin of mediastinum. This may
suggest small pneumomediastinum. There is no demonstrable
pneumothorax.

Other findings as described in the body of the report.
